# Patient Record
Sex: Female | Born: 1990 | Race: Black or African American | Hispanic: No | Marital: Single | Smoking: Never smoker
Health system: Southern US, Community
[De-identification: ages and names within clinical notes are randomized; demographics above are authoritative.]

## PROBLEM LIST (undated history)

## (undated) ENCOUNTER — Inpatient Hospital Stay (HOSPITAL_COMMUNITY): Payer: Self-pay

## (undated) DIAGNOSIS — Z3490 Encounter for supervision of normal pregnancy, unspecified, unspecified trimester: Secondary | ICD-10-CM

## (undated) DIAGNOSIS — C539 Malignant neoplasm of cervix uteri, unspecified: Secondary | ICD-10-CM

## (undated) DIAGNOSIS — A599 Trichomoniasis, unspecified: Secondary | ICD-10-CM

## (undated) DIAGNOSIS — O139 Gestational [pregnancy-induced] hypertension without significant proteinuria, unspecified trimester: Secondary | ICD-10-CM

---

## 2010-02-14 ENCOUNTER — Ambulatory Visit (HOSPITAL_COMMUNITY): Admission: RE | Admit: 2010-02-14 | Payer: Self-pay | Source: Home / Self Care | Admitting: Obstetrics and Gynecology

## 2010-02-19 ENCOUNTER — Ambulatory Visit (HOSPITAL_COMMUNITY): Admission: RE | Admit: 2010-02-19 | Payer: Self-pay | Source: Home / Self Care | Admitting: Obstetrics and Gynecology

## 2010-02-21 ENCOUNTER — Ambulatory Visit (HOSPITAL_COMMUNITY)
Admission: RE | Admit: 2010-02-21 | Discharge: 2010-02-21 | Payer: Self-pay | Source: Home / Self Care | Attending: Obstetrics and Gynecology | Admitting: Obstetrics and Gynecology

## 2010-02-25 ENCOUNTER — Other Ambulatory Visit (HOSPITAL_COMMUNITY): Payer: Self-pay | Admitting: *Deleted

## 2010-02-25 DIAGNOSIS — Z0489 Encounter for examination and observation for other specified reasons: Secondary | ICD-10-CM

## 2010-02-25 DIAGNOSIS — IMO0002 Reserved for concepts with insufficient information to code with codable children: Secondary | ICD-10-CM

## 2010-02-25 DIAGNOSIS — N83209 Unspecified ovarian cyst, unspecified side: Secondary | ICD-10-CM

## 2010-03-07 ENCOUNTER — Ambulatory Visit (HOSPITAL_COMMUNITY)
Admission: RE | Admit: 2010-03-07 | Discharge: 2010-03-07 | Disposition: A | Discharge status: Home / Self Care | Payer: Medicaid Other | Source: Ambulatory Visit | Attending: Obstetrics and Gynecology | Admitting: Obstetrics and Gynecology

## 2010-03-07 DIAGNOSIS — O351XX0 Maternal care for (suspected) chromosomal abnormality in fetus, not applicable or unspecified: Secondary | ICD-10-CM | POA: Insufficient documentation

## 2010-03-07 DIAGNOSIS — O3510X0 Maternal care for (suspected) chromosomal abnormality in fetus, unspecified, not applicable or unspecified: Secondary | ICD-10-CM | POA: Insufficient documentation

## 2010-03-28 ENCOUNTER — Ambulatory Visit (HOSPITAL_COMMUNITY)
Admission: RE | Admit: 2010-03-28 | Discharge: 2010-03-28 | Disposition: A | Discharge status: Home / Self Care | Payer: Medicaid Other | Source: Ambulatory Visit | Attending: Family Medicine | Admitting: Family Medicine

## 2010-03-28 DIAGNOSIS — N83209 Unspecified ovarian cyst, unspecified side: Secondary | ICD-10-CM

## 2010-03-28 DIAGNOSIS — O358XX0 Maternal care for other (suspected) fetal abnormality and damage, not applicable or unspecified: Secondary | ICD-10-CM | POA: Insufficient documentation

## 2010-03-28 DIAGNOSIS — Z1389 Encounter for screening for other disorder: Secondary | ICD-10-CM | POA: Insufficient documentation

## 2010-03-28 DIAGNOSIS — Z0489 Encounter for examination and observation for other specified reasons: Secondary | ICD-10-CM

## 2010-03-28 DIAGNOSIS — Z363 Encounter for antenatal screening for malformations: Secondary | ICD-10-CM | POA: Insufficient documentation

## 2010-04-10 ENCOUNTER — Other Ambulatory Visit (HOSPITAL_COMMUNITY): Payer: Self-pay | Admitting: Obstetrics and Gynecology

## 2010-04-10 DIAGNOSIS — O358XX0 Maternal care for other (suspected) fetal abnormality and damage, not applicable or unspecified: Secondary | ICD-10-CM

## 2010-04-25 ENCOUNTER — Ambulatory Visit (HOSPITAL_COMMUNITY)
Admission: RE | Admit: 2010-04-25 | Discharge: 2010-04-25 | Disposition: A | Discharge status: Home / Self Care | Payer: Medicaid Other | Source: Ambulatory Visit | Attending: Obstetrics and Gynecology | Admitting: Obstetrics and Gynecology

## 2010-04-25 DIAGNOSIS — O358XX0 Maternal care for other (suspected) fetal abnormality and damage, not applicable or unspecified: Secondary | ICD-10-CM | POA: Insufficient documentation

## 2010-04-25 DIAGNOSIS — Z3689 Encounter for other specified antenatal screening: Secondary | ICD-10-CM | POA: Insufficient documentation

## 2010-04-25 DIAGNOSIS — O3500X0 Maternal care for (suspected) central nervous system malformation or damage in fetus, unspecified, not applicable or unspecified: Secondary | ICD-10-CM | POA: Insufficient documentation

## 2010-04-25 DIAGNOSIS — O350XX0 Maternal care for (suspected) central nervous system malformation in fetus, not applicable or unspecified: Secondary | ICD-10-CM | POA: Insufficient documentation

## 2013-06-10 ENCOUNTER — Emergency Department (HOSPITAL_COMMUNITY)
Admission: EM | Admit: 2013-06-10 | Discharge: 2013-06-10 | Disposition: A | Discharge status: Home / Self Care | Payer: Medicaid Other | Attending: Emergency Medicine | Admitting: Emergency Medicine

## 2013-06-10 ENCOUNTER — Encounter (HOSPITAL_COMMUNITY): Payer: Self-pay | Admitting: Emergency Medicine

## 2013-06-10 ENCOUNTER — Emergency Department (HOSPITAL_COMMUNITY): Payer: Medicaid Other

## 2013-06-10 DIAGNOSIS — O9989 Other specified diseases and conditions complicating pregnancy, childbirth and the puerperium: Secondary | ICD-10-CM | POA: Insufficient documentation

## 2013-06-10 DIAGNOSIS — S92402A Displaced unspecified fracture of left great toe, initial encounter for closed fracture: Secondary | ICD-10-CM

## 2013-06-10 DIAGNOSIS — S91119A Laceration without foreign body of unspecified toe without damage to nail, initial encounter: Secondary | ICD-10-CM

## 2013-06-10 DIAGNOSIS — S3981XA Other specified injuries of abdomen, initial encounter: Secondary | ICD-10-CM | POA: Insufficient documentation

## 2013-06-10 DIAGNOSIS — S91109A Unspecified open wound of unspecified toe(s) without damage to nail, initial encounter: Secondary | ICD-10-CM | POA: Insufficient documentation

## 2013-06-10 DIAGNOSIS — S92919A Unspecified fracture of unspecified toe(s), initial encounter for closed fracture: Secondary | ICD-10-CM | POA: Insufficient documentation

## 2013-06-10 DIAGNOSIS — W1809XA Striking against other object with subsequent fall, initial encounter: Secondary | ICD-10-CM | POA: Insufficient documentation

## 2013-06-10 DIAGNOSIS — W19XXXA Unspecified fall, initial encounter: Secondary | ICD-10-CM

## 2013-06-10 DIAGNOSIS — Y929 Unspecified place or not applicable: Secondary | ICD-10-CM | POA: Insufficient documentation

## 2013-06-10 DIAGNOSIS — Y9301 Activity, walking, marching and hiking: Secondary | ICD-10-CM | POA: Insufficient documentation

## 2013-06-10 DIAGNOSIS — W108XXA Fall (on) (from) other stairs and steps, initial encounter: Secondary | ICD-10-CM | POA: Insufficient documentation

## 2013-06-10 HISTORY — DX: Encounter for supervision of normal pregnancy, unspecified, unspecified trimester: Z34.90

## 2013-06-10 LAB — URINALYSIS, ROUTINE W REFLEX MICROSCOPIC
Bilirubin Urine: NEGATIVE
Glucose, UA: NEGATIVE mg/dL
Hgb urine dipstick: NEGATIVE
Ketones, ur: NEGATIVE mg/dL
LEUKOCYTES UA: NEGATIVE
Nitrite: NEGATIVE
PROTEIN: NEGATIVE mg/dL
Specific Gravity, Urine: 1.016 (ref 1.005–1.030)
UROBILINOGEN UA: 1 mg/dL (ref 0.0–1.0)
pH: 6.5 (ref 5.0–8.0)

## 2013-06-10 LAB — CBC
HEMATOCRIT: 34.3 % — AB (ref 36.0–46.0)
Hemoglobin: 11.3 g/dL — ABNORMAL LOW (ref 12.0–15.0)
MCH: 29.4 pg (ref 26.0–34.0)
MCHC: 32.9 g/dL (ref 30.0–36.0)
MCV: 89.3 fL (ref 78.0–100.0)
Platelets: 147 10*3/uL — ABNORMAL LOW (ref 150–400)
RBC: 3.84 MIL/uL — ABNORMAL LOW (ref 3.87–5.11)
RDW: 15.7 % — AB (ref 11.5–15.5)
WBC: 8.2 10*3/uL (ref 4.0–10.5)

## 2013-06-10 LAB — COMPREHENSIVE METABOLIC PANEL
ALT: 11 U/L (ref 0–35)
AST: 19 U/L (ref 0–37)
Albumin: 2.3 g/dL — ABNORMAL LOW (ref 3.5–5.2)
Alkaline Phosphatase: 106 U/L (ref 39–117)
BUN: 8 mg/dL (ref 6–23)
CHLORIDE: 99 meq/L (ref 96–112)
CO2: 20 meq/L (ref 19–32)
CREATININE: 0.6 mg/dL (ref 0.50–1.10)
Calcium: 9.1 mg/dL (ref 8.4–10.5)
Glucose, Bld: 113 mg/dL — ABNORMAL HIGH (ref 70–99)
Potassium: 3.7 mEq/L (ref 3.7–5.3)
Sodium: 135 mEq/L — ABNORMAL LOW (ref 137–147)
Total Protein: 6.1 g/dL (ref 6.0–8.3)

## 2013-06-10 NOTE — Discharge Instructions (Signed)
Buddy Taping of Toes We have taped your toes together to keep them from moving. This is called "buddy taping" since we used a part of your own body to keep the injured part still. We placed soft padding between your toes to keep them from rubbing against each other. Buddy taping will help with healing and to reduce pain. Keep your toes buddy taped together for as long as directed by your caregiver. HOME CARE INSTRUCTIONS   Raise your injured area above the level of your heart while sitting or lying down. Prop it up with pillows.  An ice pack used every twenty minutes, while awake, for the first one to two days may be helpful. Put ice in a plastic bag and put a towel between the bag and your skin.  Watch for signs that the taping is too tight. These signs may be:  Numbness of your taped toes.  Coolness of your taped toes.  Color change in the area beyond the tape.  Increased pain.  If you have any of these signs, loosen or rewrap the tape. If you need to loosen or rewrap the buddy tape, make sure you use the padding again. SEEK IMMEDIATE MEDICAL CARE IF:   You have worse pain, swelling, inflammation (soreness), drainage or bleeding after you rewrap the tape.  Any new problems occur. MAKE SURE YOU:   Understand these instructions.  Will watch your condition.  Will get help right away if you are not doing well or get worse. Document Released: 10/17/2003 Document Revised: 04/06/2011 Document Reviewed: 01/10/2008 [facility] Patient Information 2014 [place].

## 2013-06-10 NOTE — ED Notes (Signed)
The pt remains alert with an occasional contraction.  Ob rapid  Response rn at the bedside.  Family at the bedside

## 2013-06-10 NOTE — ED Provider Notes (Signed)
CSN: [id number]     Arrival date & time 06/10/13  1407 History   First MD Initiated Contact with Patient 06/10/13 1433     Chief Complaint  Patient presents with  . Fall     (Consider location/radiation/quality/duration/timing/severity/associated sxs/prior Treatment) HPI 23 yo female presents after tripping down the stairs. She is [redacted] weeks pregnant (due date June 20) and fell down 4 steps after tripping on her sandals. She also hit her right flank/abdomen. She denies any cramping, vaginal bleeding or discharge. Has been feeling fetal movements like normal since the injury (just prior to arrival). Denies any numbness or weakness. No vomiting. Pain in her right side is 5/10, toe pain is 10/10.  Past Medical History  Diagnosis Date  . Pregnant    History reviewed. No pertinent past surgical history. No family history on file. History  Substance Use Topics  . Smoking status: Not on file  . Smokeless tobacco: Not on file  . Alcohol Use: No   OB History   Grav Para Term Preterm Abortions TAB SAB Ect Mult Living   1              Review of Systems  Cardiovascular: Negative for chest pain.  Gastrointestinal: Positive for abdominal pain. Negative for vomiting.  Genitourinary: Negative for hematuria and vaginal bleeding.  Skin: Positive for wound.  Neurological: Negative for weakness and numbness.      Allergies  Review of patient's allergies indicates not on file.  Home Medications   Prior to Admission medications   Not on File   BP 113/70  Pulse 110  Temp(Src) 98 F (36.7 C) (Oral)  Resp 18  SpO2 98%  LMP 10/24/2012 Physical Exam  Nursing note and vitals reviewed. Constitutional: She is oriented to person, place, and time. She appears well-developed and well-nourished.  HENT:  Head: Normocephalic and atraumatic.  Right Ear: External ear normal.  Left Ear: External ear normal.  Nose: Nose normal.  Eyes: Right eye exhibits no discharge. Left eye exhibits no  discharge.  Cardiovascular: Normal rate, regular rhythm and normal heart sounds.   Pulmonary/Chest: Effort normal and breath sounds normal.  Abdominal: Soft. There is no tenderness. There is no CVA tenderness.    Musculoskeletal:       Left foot: She exhibits tenderness.       Feet:  Neurological: She is alert and oriented to person, place, and time.  Skin: Skin is warm and dry.    ED Course  Procedures (including critical care time) Labs Review Labs Reviewed  CBC - Abnormal; Notable for the following:    RBC 3.84 (*)    Hemoglobin 11.3 (*)    HCT 34.3 (*)    RDW 15.7 (*)    Platelets 147 (*)    All other components within normal limits  COMPREHENSIVE METABOLIC PANEL - Abnormal; Notable for the following:    Sodium 135 (*)    Glucose, Bld 113 (*)    Albumin 2.3 (*)    Total Bilirubin <0.2 (*)    All other components within normal limits  URINALYSIS, ROUTINE W REFLEX MICROSCOPIC    Imaging Review Dg Toe Great Left  06/10/2013   CLINICAL DATA:  Status post fall with injury to the great toe  EXAM: LEFT GREAT TOE  COMPARISON:  None.  FINDINGS: There is a nondisplaced intra-articular fracture at the base of the first distal phalanx. There is no dislocation.  IMPRESSION: Nondisplaced intra-articular fracture at the base of the first distal  phalanx.   Electronically Signed   By: Abelardo Diesel M.D.   On: 06/10/2013 14:56     EKG Interpretation None      MDM   Final diagnoses:  Skin tear  Fracture of left great toe  Fall    Patient with fall, no LOC or head injury. Mild superficial right flank tenderness, no hematuria or concern for intra-abd injury. Benign LFTs, normal RUQ and LUQ FAST by me (difficult for suprapubic given her pregnancy. Has superficial skin tear/lac to medial aspect of left great toe. Has nondisplaced fx as well. Declines pain meds at this time or with discharge. Lac is well approximated and supreficial, no signs this is an open fracture. Well  approximated at this time and the lacerated skin part is thin enough that I do not feel repair would be beneficial. Will put on toco for 1 hour (rapid response OB nurse at bedside) and will communicate with Dr. Roselie Awkward (OB on call) and if toco negative will discharge with return precautions.    Ephraim Hamburger, MD 06/10/13 [phone]

## 2013-06-10 NOTE — Progress Notes (Signed)
Spoke with Dr. Roselie Awkward. Pt says she fell down 4 steps scraping the rt side of her abd. No vaginal bleeding or leaking of fluid. Waiting for xray of pt's toe to be done before I can place pt on monitor. Pt can have 1 hour of monitoring as long as the fhr is reactive. Pt says that she is [redacted] weeks pregnant. Her MD is in [facility].

## 2013-06-10 NOTE — ED Notes (Addendum)
Per OB Rapid Response RN, pt is cleared obstetrically.

## 2013-06-10 NOTE — Progress Notes (Signed)
Unable to admit pt into [facility]. Running FM paper.

## 2013-06-10 NOTE — Progress Notes (Signed)
Spoke to Dr. Roselie Awkward. FHR tracing is reactive. Occasional uc's with ui, better after pt emptied her bladder. Obstetrically cleared. ED MD plans to d/c pt home at this point. Labs are within normal limits.

## 2013-06-10 NOTE — Progress Notes (Signed)
This is the pt's 3rd pregnancy. Previous pregnancies were vaginal births. Due date is June 20th. Pt is [redacted] weeks pregnant.

## 2013-06-10 NOTE — Progress Notes (Signed)
Orthopedic Tech Progress Note Patient Details:  Julia Roth August 25, 1990 [id number]  Ortho Devices Type of Ortho Device: Post (short leg) splint Ortho Device/Splint Interventions: Application   Theodoro Parma Cammer 06/10/2013, 5:43 PM

## 2013-06-10 NOTE — ED Notes (Signed)
Pt. Stated, I was walking down steps and fell down 4 steps trying to keep up with my boys.  I hurt my left toe, I had on sandals.  I also got some pain in y lower abd.

## 2013-06-10 NOTE — Progress Notes (Signed)
Chaplain responded to trauma page.  Talked with patient, with her husband and with other family members.  All are concerned about grandmother, who is patient and is the reason family was coming to the hospital.  Rev. Clois Dupes [phone]

## 2013-11-27 ENCOUNTER — Encounter (HOSPITAL_COMMUNITY): Payer: Self-pay | Admitting: Emergency Medicine

## 2015-05-11 ENCOUNTER — Encounter (HOSPITAL_COMMUNITY): Payer: Self-pay | Admitting: Emergency Medicine

## 2015-05-11 ENCOUNTER — Inpatient Hospital Stay (HOSPITAL_COMMUNITY)
Admission: EM | Admit: 2015-05-11 | Discharge: 2015-05-11 | Disposition: A | Discharge status: Other Acute Inpatient Hospital | Payer: Medicaid Other | Attending: Family Medicine | Admitting: Family Medicine

## 2015-05-11 DIAGNOSIS — Z3A26 26 weeks gestation of pregnancy: Secondary | ICD-10-CM | POA: Insufficient documentation

## 2015-05-11 DIAGNOSIS — A5901 Trichomonal vulvovaginitis: Secondary | ICD-10-CM | POA: Diagnosis not present

## 2015-05-11 DIAGNOSIS — Z8541 Personal history of malignant neoplasm of cervix uteri: Secondary | ICD-10-CM | POA: Diagnosis not present

## 2015-05-11 DIAGNOSIS — O4702 False labor before 37 completed weeks of gestation, second trimester: Secondary | ICD-10-CM

## 2015-05-11 DIAGNOSIS — O479 False labor, unspecified: Secondary | ICD-10-CM | POA: Insufficient documentation

## 2015-05-11 DIAGNOSIS — O98612 Protozoal diseases complicating pregnancy, second trimester: Secondary | ICD-10-CM | POA: Insufficient documentation

## 2015-05-11 DIAGNOSIS — O98312 Other infections with a predominantly sexual mode of transmission complicating pregnancy, second trimester: Secondary | ICD-10-CM

## 2015-05-11 DIAGNOSIS — O9989 Other specified diseases and conditions complicating pregnancy, childbirth and the puerperium: Secondary | ICD-10-CM | POA: Diagnosis present

## 2015-05-11 DIAGNOSIS — Z79899 Other long term (current) drug therapy: Secondary | ICD-10-CM | POA: Insufficient documentation

## 2015-05-11 HISTORY — DX: Malignant neoplasm of cervix uteri, unspecified: C53.9

## 2015-05-11 HISTORY — DX: Trichomoniasis, unspecified: A59.9

## 2015-05-11 HISTORY — DX: Gestational (pregnancy-induced) hypertension without significant proteinuria, unspecified trimester: O13.9

## 2015-05-11 LAB — URINALYSIS, ROUTINE W REFLEX MICROSCOPIC
Bilirubin Urine: NEGATIVE
GLUCOSE, UA: NEGATIVE mg/dL
Ketones, ur: NEGATIVE mg/dL
NITRITE: NEGATIVE
PH: 6 (ref 5.0–8.0)
Protein, ur: NEGATIVE mg/dL
Specific Gravity, Urine: 1.01 (ref 1.005–1.030)

## 2015-05-11 LAB — CBC
HEMATOCRIT: 35.5 % — AB (ref 36.0–46.0)
HEMOGLOBIN: 11.4 g/dL — AB (ref 12.0–15.0)
MCH: 29.3 pg (ref 26.0–34.0)
MCHC: 32.1 g/dL (ref 30.0–36.0)
MCV: 91.3 fL (ref 78.0–100.0)
Platelets: 191 10*3/uL (ref 150–400)
RBC: 3.89 MIL/uL (ref 3.87–5.11)
RDW: 14.9 % (ref 11.5–15.5)
WBC: 10.1 10*3/uL (ref 4.0–10.5)

## 2015-05-11 LAB — URINE MICROSCOPIC-ADD ON

## 2015-05-11 LAB — COMPREHENSIVE METABOLIC PANEL
ALK PHOS: 60 U/L (ref 38–126)
ALT: 12 U/L — AB (ref 14–54)
ANION GAP: 11 (ref 5–15)
AST: 18 U/L (ref 15–41)
Albumin: 2.8 g/dL — ABNORMAL LOW (ref 3.5–5.0)
BILIRUBIN TOTAL: 0.3 mg/dL (ref 0.3–1.2)
BUN: <5 mg/dL — ABNORMAL LOW (ref 6–20)
CALCIUM: 8.3 mg/dL — AB (ref 8.9–10.3)
CO2: 21 mmol/L — AB (ref 22–32)
CREATININE: 0.62 mg/dL (ref 0.44–1.00)
Chloride: 105 mmol/L (ref 101–111)
GFR calc non Af Amer: >60 mL/min (ref >60)
GLUCOSE: 73 mg/dL (ref 65–99)
Potassium: 3.8 mmol/L (ref 3.5–5.1)
SODIUM: 137 mmol/L (ref 135–145)
TOTAL PROTEIN: 6.6 g/dL (ref 6.5–8.1)

## 2015-05-11 LAB — AMNISURE RUPTURE OF MEMBRANE (ROM) NOT AT ARMC: Amnisure ROM: NEGATIVE

## 2015-05-11 MED ORDER — METRONIDAZOLE 500 MG PO TABS
2000.0000 mg | ORAL_TABLET | Freq: Once | ORAL | Status: AC
  Administered 2015-05-11: 2000 mg via ORAL
  Filled 2015-05-11: qty 4

## 2015-05-11 NOTE — Progress Notes (Addendum)
[facility] Report called to [facility] in MAU

## 2015-05-11 NOTE — ED Provider Notes (Signed)
CSN: [id number]     Arrival date & time 05/11/15  [id number] History   First MD Initiated Contact with Patient 05/11/15 [phone]     Chief Complaint  Patient presents with  . Vaginal Discharge  . Contractions     The history is provided by the patient. No language interpreter was used.   Julia Roth is a 25 y.o. female who presents to the Emergency Department complaining of contractions.  Patient is a G4 P3 at 26 weeks with due date of July 20. She's been having intermittent contractions since last night and this morning felt a gush of fluid. Her OB/GYN is at [place]. She's had no problems with prior pregnancies. Today her contractions have been approximately 3 minutes apart lasting about 1 minute a piece. No recent illnesses or abdominal trauma. No fevers, vomiting, diarrhea.  Past Medical History  Diagnosis Date  . Pregnant   . Pregnancy induced hypertension   . Cervical cancer ([place])   . Trichomonas infection    History reviewed. No pertinent past surgical history. History reviewed. No pertinent family history. Social History  Substance Use Topics  . Smoking status: Never Smoker   . Smokeless tobacco: None  . Alcohol Use: No   OB History    Gravida Para Term Preterm AB TAB SAB Ectopic Multiple Living   5 3   1  1   3      Review of Systems  All other systems reviewed and are negative.     Allergies  Mushroom extract complex  Home Medications   Prior to Admission medications   Medication Sig Start Date End Date Taking? Authorizing Provider  Prenatal Vit-Fe Fumarate-FA (PRENATAL MULTIVITAMIN) TABS tablet Take 1 tablet by mouth daily at 12 noon.    Yes Historical Provider, MD   BP 129/66 mmHg  Pulse 93  Temp(Src) 98.1 F (36.7 C) (Oral)  Resp 18  SpO2 98% Physical Exam  Constitutional: She is oriented to person, place, and time. She appears well-developed and well-nourished.  HENT:  Head: Normocephalic and atraumatic.  Cardiovascular: Normal  rate and regular rhythm.   No murmur heard. Pulmonary/Chest: Effort normal and breath sounds normal. No respiratory distress.  Abdominal: Soft. There is no rebound and no guarding.  Gravid uterus, nontender abdomen.  Genitourinary:  Speculum examination with large amounts of thick yellow discharge. No fetal parts seen  Musculoskeletal: She exhibits no edema or tenderness.  Neurological: She is alert and oriented to person, place, and time.  Skin: Skin is warm and dry.  Psychiatric: She has a normal mood and affect. Her behavior is normal.  Nursing note and vitals reviewed.   ED Course  Procedures (including critical care time) Labs Review Labs Reviewed  CBC - Abnormal; Notable for the following:    Hemoglobin 11.4 (*)    HCT 35.5 (*)    All other components within normal limits  COMPREHENSIVE METABOLIC PANEL - Abnormal; Notable for the following:    CO2 21 (*)    BUN <5 (*)    Calcium 8.3 (*)    Albumin 2.8 (*)    ALT 12 (*)    All other components within normal limits  URINALYSIS, ROUTINE W REFLEX MICROSCOPIC (NOT AT [facility]) - Abnormal; Notable for the following:    APPearance HAZY (*)    Hgb urine dipstick SMALL (*)    Leukocytes, UA LARGE (*)    All other components within normal limits  URINE MICROSCOPIC-ADD ON - Abnormal; Notable for the  following:    Squamous Epithelial / LPF 6-30 (*)    Bacteria, UA MANY (*)    All other components within normal limits  AMNISURE RUPTURE OF MEMBRANE (ROM) NOT AT [facility]  GC/CHLAMYDIA PROBE AMP ([place]) NOT AT [facility]    Imaging Review No results found. I have personally reviewed and evaluated these images and lab results as part of my medical decision-making.   EKG Interpretation None      MDM   Final diagnoses:  Vaginal trichomoniasis    Patient's [redacted] weeks pregnant here with contractions, leakage of fluids. She is having irregular contractions in the department. Speculum exam with large amounts of discharge. Plan to  transfer to [facility] for evaluation of possible preterm labor. Discussed with Dr. Kennon Rounds at [facility] that accepts the patient in transfer.    Quintella Reichert, MD 05/11/15 [phone]

## 2015-05-11 NOTE — Progress Notes (Signed)
Called dr Kennon Rounds informed of being called to [place], for g5 p3 26.2 weeks complaints of leaking fluid and contractions. Pt gets prenatal care in [place]. To have come MD do spec exam. Will call her back with results.

## 2015-05-11 NOTE — MAU Note (Signed)
[facility] from [facility]

## 2015-05-11 NOTE — ED Notes (Signed)
MD Ralene Bathe at bedside, Performed speculum exam. Tan colored secretions noted on speculum with little amount of blood.

## 2015-05-11 NOTE — MAU Provider Note (Signed)
History   [id number]   Chief Complaint  Patient presents with  . Vaginal Discharge  . Contractions    HPI Julia Roth is a 25 y.o. female  [id number] at [redacted]w[redacted]d IUP here from [facility] ED for rule-out rupture.  Pt presented to ED for contractions that started last night and suspected rupture of membranes this morning.  Receives prenatal care in [place].  Denies problems with prior pregnancies.  Reports treatment for trichomoniasis x 2 during this pregnancy.  Denies sexual encounter with that individual since first time treated.  Verbalizes being upset that it's present in urine today.  States contractions has lessened.    No LMP recorded. Patient is pregnant.  OB History  Gravida Para Term Preterm AB SAB TAB Ectopic Multiple Living  5 3   1 1    3     # Outcome Date GA Lbr Len/2nd Weight Sex Delivery Anes PTL Lv  5 Current           4 SAB           3 Para           2 Para           1 Para               Past Medical History  Diagnosis Date  . Pregnant   . Pregnancy induced hypertension   . Cervical cancer ([place])   . Trichomonas infection     History reviewed. No pertinent family history.  Social History   Social History  . Marital Status: Single    Spouse Name: N/A  . Number of Children: N/A  . Years of Education: N/A   Social History Main Topics  . Smoking status: Never Smoker   . Smokeless tobacco: None  . Alcohol Use: No  . Drug Use: No  . Sexual Activity: Not Asked   Other Topics Concern  . None   Social History Narrative    Allergies  Allergen Reactions  . Mushroom Extract Complex Swelling    Face swelling    No current facility-administered medications on file prior to encounter.   Current Outpatient Prescriptions on File Prior to Encounter  Medication Sig Dispense Refill  . Prenatal Vit-Fe Fumarate-FA (PRENATAL MULTIVITAMIN) TABS tablet Take 1 tablet by mouth daily at 12 noon.        Review of Systems  Constitutional: Negative for  fever and chills.  Genitourinary: Positive for vaginal discharge and pelvic pain (contractions). Negative for dysuria, hematuria and vaginal bleeding.  All other systems reviewed and are negative.    Physical Exam   Filed Vitals:   05/11/15 1015 05/11/15 1030 05/11/15 1045 05/11/15 1115  BP: 100/64 104/56 104/58 113/60  Pulse: 86 93 87 72  Temp:    98.1 F (36.7 C)  TempSrc:    Oral  Resp:    18  SpO2: 97% 99% 98%     Physical Exam  Constitutional: She is oriented to person, place, and time. She appears well-developed and well-nourished.  HENT:  Head: Normocephalic.  Neck: Normal range of motion. Neck supple.  Cardiovascular: Normal rate, regular rhythm and normal heart sounds.   Respiratory: Effort normal and breath sounds normal. No respiratory distress.  GI: Soft. There is no tenderness.  Genitourinary: No bleeding in the vagina. Vaginal discharge (thin yellowish) found.  Negative pooling; fern negative  Musculoskeletal: Normal range of motion. She exhibits no edema.  Neurological: She is alert and oriented to person, place,  and time.  Skin: Skin is warm and dry.   Dilation: Closed Effacement (%): Thick Cervical Position: Posterior Exam by:: Reina Fuse, CNM   MAU Course  Procedures  MDM Results for orders placed or performed during the hospital encounter of 05/11/15 (from the past 24 hour(s))  CBC     Status: Abnormal   Collection Time: 05/11/15  9:40 AM  Result Value Ref Range   WBC 10.1 4.0 - 10.5 K/uL   RBC 3.89 3.87 - 5.11 MIL/uL   Hemoglobin 11.4 (L) 12.0 - 15.0 g/dL   HCT 35.5 (L) 36.0 - 46.0 %   MCV 91.3 78.0 - 100.0 fL   MCH 29.3 26.0 - 34.0 pg   MCHC 32.1 30.0 - 36.0 g/dL   RDW 14.9 11.5 - 15.5 %   Platelets 191 150 - 400 K/uL  Comprehensive metabolic panel     Status: Abnormal   Collection Time: 05/11/15  9:40 AM  Result Value Ref Range   Sodium 137 135 - 145 mmol/L   Potassium 3.8 3.5 - 5.1 mmol/L   Chloride 105 101 - 111 mmol/L   CO2 21 (L) 22  - 32 mmol/L   Glucose, Bld 73 65 - 99 mg/dL   BUN <5 (L) 6 - 20 mg/dL   Creatinine, Ser 0.62 0.44 - 1.00 mg/dL   Calcium 8.3 (L) 8.9 - 10.3 mg/dL   Total Protein 6.6 6.5 - 8.1 g/dL   Albumin 2.8 (L) 3.5 - 5.0 g/dL   AST 18 15 - 41 U/L   ALT 12 (L) 14 - 54 U/L   Alkaline Phosphatase 60 38 - 126 U/L   Total Bilirubin 0.3 0.3 - 1.2 mg/dL   GFR calc non Af Amer >60 >60 mL/min   GFR calc Af Amer >60 >60 mL/min   Anion gap 11 5 - 15  Urinalysis, Routine w reflex microscopic (not at [facility])     Status: Abnormal   Collection Time: 05/11/15 11:00 AM  Result Value Ref Range   Color, Urine YELLOW YELLOW   APPearance HAZY (A) CLEAR   Specific Gravity, Urine 1.010 1.005 - 1.030   pH 6.0 5.0 - 8.0   Glucose, UA NEGATIVE NEGATIVE mg/dL   Hgb urine dipstick SMALL (A) NEGATIVE   Bilirubin Urine NEGATIVE NEGATIVE   Ketones, ur NEGATIVE NEGATIVE mg/dL   Protein, ur NEGATIVE NEGATIVE mg/dL   Nitrite NEGATIVE NEGATIVE   Leukocytes, UA LARGE (A) NEGATIVE  Urine microscopic-add on     Status: Abnormal   Collection Time: 05/11/15 11:00 AM  Result Value Ref Range   Squamous Epithelial / LPF 6-30 (A) NONE SEEN   WBC, UA TOO NUMEROUS TO COUNT 0 - 5 WBC/hpf   RBC / HPF 6-30 0 - 5 RBC/hpf   Bacteria, UA MANY (A) NONE SEEN   Urine-Other TRICHOMONAS PRESENT   Amnisure rupture of membrane (rom)not at [facility]     Status: None   Collection Time: 05/11/15 12:08 PM  Result Value Ref Range   Amnisure ROM NEGATIVE    FHR 130's Toco - irregular  Assessment and Plan  25 y.o. [id number] at [redacted]w[redacted]d IUP  Trichomoniasis Braxton Hicks - closed cervix  Plan: Discharge home 2 GM Flagyl in MAU Follow-up with OB office Reviewed preterm labor precautions  Gwen Pounds, CNM 05/11/2015 1:50 PM

## 2015-05-11 NOTE — ED Notes (Signed)
Pt c/o having abdominal contractions onset yesterday. Today pt was in kitchen fixing cereal for her other child when fluid began trickle out. Pt reports fluid is clear in color. Pt having irregular contractions at this time.

## 2015-05-11 NOTE — ED Notes (Signed)
Rapid OB nurse at bedside. Pt continues to have contractions reports they are getting stronger.

## 2015-05-11 NOTE — Discharge Instructions (Signed)
Trichomoniasis Trichomoniasis is an infection caused by an organism called Trichomonas. The infection can affect both women and men. In women, the outer female genitalia and the vagina are affected. In men, the penis is mainly affected, but the prostate and other reproductive organs can also be involved. Trichomoniasis is a sexually transmitted infection (STI) and is most often passed to another person through sexual contact.  RISK FACTORS  Having unprotected sexual intercourse.  Having sexual intercourse with an infected partner. SIGNS AND SYMPTOMS  Symptoms of trichomoniasis in women include:  Abnormal gray-green frothy vaginal discharge.  Itching and irritation of the vagina.  Itching and irritation of the area outside the vagina. Symptoms of trichomoniasis in men include:   Penile discharge with or without pain.  Pain during urination. This results from inflammation of the urethra. DIAGNOSIS  Trichomoniasis may be found during a Pap test or physical exam. Your health care provider may use one of the following methods to help diagnose this infection:  Testing the pH of the vagina with a test tape.  Using a vaginal swab test that checks for the Trichomonas organism. A test is available that provides results within a few minutes.  Examining a urine sample.  Testing vaginal secretions. Your health care provider may test you for other STIs, including HIV. TREATMENT   You may be given medicine to fight the infection. Women should inform their health care provider if they could be or are pregnant. Some medicines used to treat the infection should not be taken during pregnancy.  Your health care provider may recommend over-the-counter medicines or creams to decrease itching or irritation.  Your sexual partner will need to be treated if infected.  Your health care provider may test you for infection again 3 months after treatment. HOME CARE INSTRUCTIONS   Take medicines only as  directed by your health care provider.  Take over-the-counter medicine for itching or irritation as directed by your health care provider.  Do not have sexual intercourse while you have the infection.  Women should not douche or wear tampons while they have the infection.  Discuss your infection with your partner. Your partner may have gotten the infection from you, or you may have gotten it from your partner.  Have your sex partner get examined and treated if necessary.  Practice safe, informed, and protected sex.  See your health care provider for other STI testing. SEEK MEDICAL CARE IF:   You still have symptoms after you finish your medicine.  You develop abdominal pain.  You have pain when you urinate.  You have bleeding after sexual intercourse.  You develop a rash.  Your medicine makes you sick or makes you throw up (vomit). MAKE SURE YOU:  Understand these instructions.  Will watch your condition.  Will get help right away if you are not doing well or get worse.   This information is not intended to replace advice given to you by your health care provider. Make sure you discuss any questions you have with your health care provider.   Document Released: 07/08/2000 Document Revised: 02/02/2014 Document Reviewed: 10/24/2012 Elsevier Interactive Patient Education 2016 [employer]  

## 2015-05-13 LAB — GC/CHLAMYDIA PROBE AMP (~~LOC~~) NOT AT ARMC
Chlamydia: NEGATIVE
NEISSERIA GONORRHEA: NEGATIVE

## 2016-03-15 ENCOUNTER — Encounter (HOSPITAL_COMMUNITY): Payer: Self-pay

## 2020-03-30 ENCOUNTER — Emergency Department (HOSPITAL_BASED_OUTPATIENT_CLINIC_OR_DEPARTMENT_OTHER): Payer: Self-pay

## 2020-03-30 ENCOUNTER — Encounter (HOSPITAL_BASED_OUTPATIENT_CLINIC_OR_DEPARTMENT_OTHER): Payer: Self-pay

## 2020-03-30 ENCOUNTER — Emergency Department (HOSPITAL_BASED_OUTPATIENT_CLINIC_OR_DEPARTMENT_OTHER)
Admission: EM | Admit: 2020-03-30 | Discharge: 2020-03-30 | Disposition: A | Discharge status: Home / Self Care | Payer: Self-pay | Attending: Emergency Medicine | Admitting: Emergency Medicine

## 2020-03-30 ENCOUNTER — Other Ambulatory Visit: Payer: Self-pay

## 2020-03-30 DIAGNOSIS — Z8541 Personal history of malignant neoplasm of cervix uteri: Secondary | ICD-10-CM | POA: Insufficient documentation

## 2020-03-30 DIAGNOSIS — R1032 Left lower quadrant pain: Secondary | ICD-10-CM | POA: Insufficient documentation

## 2020-03-30 DIAGNOSIS — N390 Urinary tract infection, site not specified: Secondary | ICD-10-CM | POA: Insufficient documentation

## 2020-03-30 DIAGNOSIS — M545 Low back pain, unspecified: Secondary | ICD-10-CM | POA: Insufficient documentation

## 2020-03-30 LAB — COMPREHENSIVE METABOLIC PANEL
ALT: 19 U/L (ref 0–44)
AST: 21 U/L (ref 15–41)
Albumin: 4 g/dL (ref 3.5–5.0)
Alkaline Phosphatase: 34 U/L — ABNORMAL LOW (ref 38–126)
Anion gap: 8 (ref 5–15)
BUN: 14 mg/dL (ref 6–20)
CO2: 25 mmol/L (ref 22–32)
Calcium: 9 mg/dL (ref 8.9–10.3)
Chloride: 107 mmol/L (ref 98–111)
Creatinine, Ser: 0.94 mg/dL (ref 0.44–1.00)
GFR, Estimated: >60 mL/min (ref >60)
Glucose, Bld: 100 mg/dL — ABNORMAL HIGH (ref 70–99)
Potassium: 4 mmol/L (ref 3.5–5.1)
Sodium: 140 mmol/L (ref 135–145)
Total Bilirubin: 0.6 mg/dL (ref 0.3–1.2)
Total Protein: 6.6 g/dL (ref 6.5–8.1)

## 2020-03-30 LAB — CBC
HCT: 37.9 % (ref 36.0–46.0)
Hemoglobin: 12.3 g/dL (ref 12.0–15.0)
MCH: 28.9 pg (ref 26.0–34.0)
MCHC: 32.5 g/dL (ref 30.0–36.0)
MCV: 89 fL (ref 80.0–100.0)
Platelets: 203 10*3/uL (ref 150–400)
RBC: 4.26 MIL/uL (ref 3.87–5.11)
RDW: 15.4 % (ref 11.5–15.5)
WBC: 7 10*3/uL (ref 4.0–10.5)
nRBC: 0 % (ref 0.0–0.2)

## 2020-03-30 LAB — URINALYSIS, MICROSCOPIC (REFLEX)

## 2020-03-30 LAB — URINALYSIS, ROUTINE W REFLEX MICROSCOPIC
Bilirubin Urine: NEGATIVE
Glucose, UA: NEGATIVE mg/dL
Hgb urine dipstick: NEGATIVE
Ketones, ur: NEGATIVE mg/dL
Nitrite: NEGATIVE
Protein, ur: NEGATIVE mg/dL
Specific Gravity, Urine: 1.025 (ref 1.005–1.030)
pH: 6 (ref 5.0–8.0)

## 2020-03-30 LAB — LIPASE, BLOOD: Lipase: 28 U/L (ref 11–51)

## 2020-03-30 LAB — PREGNANCY, URINE: Preg Test, Ur: NEGATIVE

## 2020-03-30 MED ORDER — IOHEXOL 300 MG/ML  SOLN
100.0000 mL | Freq: Once | INTRAMUSCULAR | Status: AC | PRN
  Administered 2020-03-30: 100 mL via INTRAVENOUS

## 2020-03-30 MED ORDER — METHOCARBAMOL 500 MG PO TABS: 500.0000 mg | ORAL_TABLET | Freq: Two times a day (BID) | ORAL | 0 refills | Status: DC

## 2020-03-30 MED ORDER — CEPHALEXIN 500 MG PO CAPS: 500.0000 mg | ORAL_CAPSULE | Freq: Two times a day (BID) | ORAL | 0 refills | Status: AC

## 2020-03-30 MED ORDER — LIDOCAINE 5 % EX PTCH
1.0000 | MEDICATED_PATCH | CUTANEOUS | Status: DC
  Administered 2020-03-30: 1 via TRANSDERMAL
  Filled 2020-03-30: qty 1

## 2020-03-30 MED ORDER — CEPHALEXIN 250 MG PO CAPS
500.0000 mg | ORAL_CAPSULE | Freq: Once | ORAL | Status: AC
  Administered 2020-03-30: 500 mg via ORAL
  Filled 2020-03-30: qty 2

## 2020-03-30 NOTE — ED Notes (Signed)
Patient transported to CT 

## 2020-03-30 NOTE — ED Triage Notes (Signed)
Pt c/o left lower abd pain 7/10 comes and goes for one week. Lower back pain, and nausea and vomiting (x4)  started this AM ~0300.

## 2020-03-30 NOTE — Discharge Instructions (Signed)
You were seen in the emergency department today for your abdominal pain and back pain.  Your blood work, CT scan, and vital signs are very reassuring. Your urine appears infected.  You prescribed antibiotic to treat urinary tract infection.  You should follow-up with your primary care doctor for recheck of your urine to confirm resolution of your infection.  Regarding your abdominal pain, while exact source of your pain remains unclear, there does not appear to be any emergent cause at this time.  Regarding your low back pain, the muscles in your low back are in what is called spasm, meaning they are inappropriately tightened up.  This can be quite painful.  To help with your pain you may take Tylenol and / or NSAID medication (such as ibuprofen or naproxen) to help with your pain.  Additionally, you have been prescribed a muscle relaxer called Robaxin to help relieve some of the muscle spasm.  Please be advised that this medication may make you very sleepy, so you should not drive or operate heavy machinery while you are taking it.  You may also utilize topical pain relief such as Biofreeze, IcyHot, or topical lidocaine patches.  I also recommend that you apply heat to the area, such as a hot shower or heating pad, and follow heat application with massage of the muscles that are most tight.  Please return to the emergency department if you develop any numbness/tingling/weakness in your arms or legs, any difficulty urinating, or urinary incontinence chest pain, shortness of breath, abdominal pain, nausea or vomiting that does not stop, or any other new severe symptoms.

## 2020-03-30 NOTE — ED Provider Notes (Signed)
[facility] Provider Note   CSN: [id number] Arrival date & time: 03/30/20  1006     History Chief Complaint  Patient presents with  . Abdominal Pain    Julia Roth is a 30 y.o. female who presents concern for 1 week of constant left lower abdominal pain rated a 7/10, as well as intermittent bilateral low back pain.  She endorses 4 episodes of NBNB emesis this morning.  Additionally patient feels that her abdomen has been growing in size since January.  Last menstrual period is 01/31/2020.  Patient is not currently on any birth control, and she is sexually active in a monogamous relationship with her female partner of 5 years.  She denies any vaginal bleeding, discharge, or skin changes.  She denies any concern for exposure to sexually transmitted infection.  She denies diarrhea, states most recent bowel movement was yesterday, denies melena or hematochezia.  She denies any chest pain, shortness of breath, palpitations. She also endorses urinary frequency and urgency, but denies dysuria or hematuria.  Back pain is bilateral, L>R.  Without radiation of pain, not relieved by OTC analgesia at home.  I personally read this patient's medical records.  She is history of hypertension during pregnancy, as well as cervical cancer.  She is not currently on any medications every day.    HPI     Past Medical History:  Diagnosis Date  . Cervical cancer ([place])   . Pregnancy induced hypertension   . Pregnant   . Trichomonas infection     There are no problems to display for this patient.   No past surgical history on file.   OB History    Gravida  5   Para  3   Term      Preterm      AB  1   Living  3     SAB  1   IAB      Ectopic      Multiple      Live Births              No family history on file.  Social History   Tobacco Use  . Smoking status: Never Smoker  Vaping Use  . Vaping Use: Never used  Substance Use Topics  .  Alcohol use: No  . Drug use: No    Home Medications Prior to Admission medications   Medication Sig Start Date End Date Taking? Authorizing Provider  cephALEXin (KEFLEX) 500 MG capsule Take 1 capsule (500 mg total) by mouth 2 (two) times daily for 7 days. 03/30/20 04/06/20 Yes Kasiyah Platter, Gypsy Balsam, PA-C  methocarbamol (ROBAXIN) 500 MG tablet Take 1 tablet (500 mg total) by mouth 2 (two) times daily. 03/30/20  Yes Bain Whichard, Gypsy Balsam, PA-C  Prenatal Vit-Fe Fumarate-FA (PRENATAL MULTIVITAMIN) TABS tablet Take 1 tablet by mouth daily at 12 noon.     [provider]    Allergies    Mushroom extract complex  Review of Systems   Review of Systems  Constitutional: Negative for activity change, appetite change, chills, diaphoresis, fatigue and fever.  HENT: Negative.   Eyes: Negative.   Respiratory: Negative.  Negative for shortness of breath.   Cardiovascular: Negative.  Negative for chest pain, palpitations and leg swelling.  Gastrointestinal: Positive for abdominal pain, nausea and vomiting. Negative for blood in stool, constipation and diarrhea.  Genitourinary: Positive for flank pain, frequency, menstrual problem and urgency. Negative for decreased urine volume, difficulty urinating, dyspareunia, dysuria,  enuresis, genital sores, hematuria, pelvic pain, vaginal bleeding, vaginal discharge and vaginal pain.  Musculoskeletal: Positive for back pain. Negative for gait problem.  Skin: Negative.   Neurological: Negative.     Physical Exam Updated Vital Signs BP 132/85   Pulse 77   Temp 98.2 F (36.8 C)   Resp 16   Ht 5\' 4"  (1.626 m)   Wt 72.2 kg   LMP 01/31/2020   SpO2 100%   BMI 27.30 kg/m   Physical Exam Vitals and nursing note reviewed.  Constitutional:      Appearance: She is overweight. She is not ill-appearing.  HENT:     Head: Normocephalic and atraumatic.     Mouth/Throat:     Mouth: Mucous membranes are moist.     Pharynx: Oropharynx is clear. Uvula midline.  No oropharyngeal exudate or posterior oropharyngeal erythema.  Eyes:     General:        Right eye: No discharge.        Left eye: No discharge.     Extraocular Movements: Extraocular movements intact.     Conjunctiva/sclera: Conjunctivae normal.     Pupils: Pupils are equal, round, and reactive to light.  Neck:     Trachea: Trachea and phonation normal.  Cardiovascular:     Rate and Rhythm: Normal rate and regular rhythm.     Pulses: Normal pulses.          Radial pulses are 2+ on the right side and 2+ on the left side.       Dorsalis pedis pulses are 2+ on the right side and 2+ on the left side.     Heart sounds: Normal heart sounds. No murmur heard.   Pulmonary:     Effort: Pulmonary effort is normal. No respiratory distress.     Breath sounds: Normal breath sounds. No wheezing or rales.  Chest:     Chest wall: No deformity, swelling, tenderness, crepitus or edema.  Abdominal:     General: Bowel sounds are normal. There is no distension.     Palpations: Abdomen is soft.     Tenderness: There is abdominal tenderness in the left upper quadrant and left lower quadrant. There is left CVA tenderness. There is no right CVA tenderness, guarding or rebound.  Musculoskeletal:        General: No deformity.     Cervical back: Normal, normal range of motion and neck supple. No spasms, tenderness, bony tenderness or crepitus. No pain with movement or spinous process tenderness.     Thoracic back: Normal. No spasms, tenderness or bony tenderness.     Lumbar back: Spasms and tenderness present. No swelling, deformity or bony tenderness. Negative right straight leg raise test and negative left straight leg raise test.     Right lower leg: No edema.     Left lower leg: No edema.  Lymphadenopathy:     Cervical: No cervical adenopathy.  Skin:    General: Skin is warm and dry.     Capillary Refill: Capillary refill takes less than 2 seconds.  Neurological:     General: No focal deficit  present.     Mental Status: She is alert and oriented to person, place, and time. Mental status is at baseline.     Sensory: Sensation is intact.     Motor: Motor function is intact.     Gait: Gait is intact.  Psychiatric:        Mood and Affect: Mood normal.  ED Results / Procedures / Treatments   Labs (all labs ordered are listed, but only abnormal results are displayed) Labs Reviewed  COMPREHENSIVE METABOLIC PANEL - Abnormal; Notable for the following components:      Result Value   Glucose, Bld 100 (*)    Alkaline Phosphatase 34 (*)    All other components within normal limits  URINALYSIS, ROUTINE W REFLEX MICROSCOPIC - Abnormal; Notable for the following components:   APPearance HAZY (*)    Leukocytes,Ua SMALL (*)    All other components within normal limits  URINALYSIS, MICROSCOPIC (REFLEX) - Abnormal; Notable for the following components:   Bacteria, UA MANY (*)    All other components within normal limits  LIPASE, BLOOD  CBC  PREGNANCY, URINE    EKG EKG: normal sinus rhythm, no STEMI.  Radiology CT Abdomen Pelvis W Contrast  Result Date: 03/30/2020 CLINICAL DATA:  Left lower abdominal pain EXAM: CT ABDOMEN AND PELVIS WITH CONTRAST TECHNIQUE: Multidetector CT imaging of the abdomen and pelvis was performed using the standard protocol following bolus administration of intravenous contrast. CONTRAST:  132mL OMNIPAQUE IOHEXOL 300 MG/ML  SOLN COMPARISON:  06/17/2017 FINDINGS: Lower chest: No pleural or pericardial effusion. Hepatobiliary: No focal liver abnormality is seen. No gallstones, gallbladder wall thickening, or biliary dilatation. Pancreas: Unremarkable. No pancreatic ductal dilatation or surrounding inflammatory changes. Spleen: Normal in size without focal abnormality. Adrenals/Urinary Tract: Adrenal glands are unremarkable. Kidneys are normal, without renal calculi, focal lesion, or hydronephrosis. Bladder is unremarkable. Stomach/Bowel: Stomach is incompletely  distended, unremarkable. Small bowel decompressed. Appendix not discretely identified. The colon is nondilated. Vascular/Lymphatic: No significant vascular findings are present. No enlarged abdominal or pelvic lymph nodes. Reproductive: Uterus and bilateral adnexa are unremarkable. Other: Bilateral pelvic phleboliths.  No ascites.  No free air. Musculoskeletal: No acute or significant osseous findings. IMPRESSION: No acute findings. Electronically Signed   By: Lucrezia Europe M.D.   On: 03/30/2020 12:57    Procedures Procedures   Medications Ordered in ED Medications  lidocaine (LIDODERM) 5 % 1 patch (1 patch Transdermal Patch Applied 03/30/20 1404)  iohexol (OMNIPAQUE) 300 MG/ML solution 100 mL (100 mLs Intravenous Contrast Given 03/30/20 1246)  cephALEXin (KEFLEX) capsule 500 mg (500 mg Oral Given 03/30/20 1405)    ED Course  I have reviewed the triage vital signs and the nursing notes.  Pertinent labs & imaging results that were available during my care of the patient were reviewed by me and considered in my medical decision making (see chart for details).    MDM Rules/Calculators/A&P                         30 year old female presents with concern for 1 week of left lower abdominal pain and bilateral back pain.  Concerned she may be pregnant, associated urinary symptoms without vaginal symptoms or pelvic pain.  Vital signs are normal on intake.  Cardiopulmonary exam is normal, abdominal exam revealed left upper and lower quadrant tenderness to palpation as well suprapubic tenderness to palpation without rebound or guarding.  There is left-sided CVA tenderness as well.  Bilateral lumbar paraspinous muscular spasm, L>R.  Neurovascularly intact in all 4 extremities. No skin changes suggestive of herpes zoster.   Differential diagnosis for the patient's left-sided abdominal pain includes but is not limited to diverticulitis, nephrolithiasis/ureterolithiasis, pyelonephritis, ectopic pregnancy, IBD,  infectious colitis, mesenteric ischemia, ovarian torsion, PID, psoas abscess, AAA endometriosis, colon cancer, bowel obstruction.  CBC unremarkable, CMP unremarkable, lipase normal, patient  is not pregnant.  UA with small leukocytes with many bacteria and mucus.  Will proceed with CT abdomen pelvis at this time.  CT AP negative for acute abnormality. Patient reevaluated with the improvement in her pain at this time without analgesia.  Given reassuring physical exam, vital signs, laboratory studies and imaging studies no further work-up is warranted in the ED at this time.  Will treat patient for urinary symptoms given possible early infection on her UA.  Recommend close follow-up with her primary care doctor.  While exact etiology for patient's left-sided abdominal pain remains unclear, there does not appear to be any emergent cause of her symptoms.  Regarding her bilateral lumbar pain, patient has bilateral lumbar spasm without concern for cord compression.  Will discharge with course of Robaxin.  Isadora voiced understanding of her medical evaluation and treatment plan.  Each of her questions was answered to her expressed satisfaction.  Return precautions are given.  Patient is well-appearing, stable, and appropriate for discharge at this time.  This chart was dictated using voice recognition software, Dragon. Despite the best efforts of this provider to proofread and correct errors, errors may still occur which can change documentation meaning.  Final Clinical Impression(s) / ED Diagnoses Final diagnoses:  Left lower quadrant abdominal pain  Lower urinary tract infectious disease    Rx / DC Orders ED Discharge Orders         Ordered    methocarbamol (ROBAXIN) 500 MG tablet  2 times daily        03/30/20 1345    cephALEXin (KEFLEX) 500 MG capsule  2 times daily        03/30/20 [address] Gypsy Balsam, PA-C 03/30/20 1711    Davonna Belling, MD 03/31/20 1525

## 2020-03-30 NOTE — ED Notes (Signed)
Pt discharged to home. Discharge instructions have been discussed with patient and/or family members. Pt verbally acknowledges understanding d/c instructions, and endorses comprehension to checkout at registration before leaving.  °

## 2020-10-01 ENCOUNTER — Emergency Department (HOSPITAL_BASED_OUTPATIENT_CLINIC_OR_DEPARTMENT_OTHER)
Admission: EM | Admit: 2020-10-01 | Discharge: 2020-10-01 | Disposition: A | Discharge status: Home / Self Care | Payer: Self-pay | Attending: Emergency Medicine | Admitting: Emergency Medicine

## 2020-10-01 ENCOUNTER — Other Ambulatory Visit: Payer: Self-pay

## 2020-10-01 ENCOUNTER — Encounter (HOSPITAL_BASED_OUTPATIENT_CLINIC_OR_DEPARTMENT_OTHER): Payer: Self-pay | Admitting: *Deleted

## 2020-10-01 ENCOUNTER — Emergency Department (HOSPITAL_BASED_OUTPATIENT_CLINIC_OR_DEPARTMENT_OTHER): Payer: Self-pay

## 2020-10-01 DIAGNOSIS — R110 Nausea: Secondary | ICD-10-CM | POA: Insufficient documentation

## 2020-10-01 DIAGNOSIS — Z8541 Personal history of malignant neoplasm of cervix uteri: Secondary | ICD-10-CM | POA: Insufficient documentation

## 2020-10-01 DIAGNOSIS — R079 Chest pain, unspecified: Secondary | ICD-10-CM

## 2020-10-01 DIAGNOSIS — R0789 Other chest pain: Secondary | ICD-10-CM | POA: Insufficient documentation

## 2020-10-01 LAB — CBC WITH DIFFERENTIAL/PLATELET
Abs Immature Granulocytes: 0.02 10*3/uL (ref 0.00–0.07)
Basophils Absolute: 0 10*3/uL (ref 0.0–0.1)
Basophils Relative: 1 %
Eosinophils Absolute: 0.4 10*3/uL (ref 0.0–0.5)
Eosinophils Relative: 5 %
HCT: 39.7 % (ref 36.0–46.0)
Hemoglobin: 13.3 g/dL (ref 12.0–15.0)
Immature Granulocytes: 0 %
Lymphocytes Relative: 27 %
Lymphs Abs: 2 10*3/uL (ref 0.7–4.0)
MCH: 30.6 pg (ref 26.0–34.0)
MCHC: 33.5 g/dL (ref 30.0–36.0)
MCV: 91.3 fL (ref 80.0–100.0)
Monocytes Absolute: 0.7 10*3/uL (ref 0.1–1.0)
Monocytes Relative: 9 %
Neutro Abs: 4.3 10*3/uL (ref 1.7–7.7)
Neutrophils Relative %: 58 %
Platelets: 160 10*3/uL (ref 150–400)
RBC: 4.35 MIL/uL (ref 3.87–5.11)
RDW: 16.1 % — ABNORMAL HIGH (ref 11.5–15.5)
WBC: 7.4 10*3/uL (ref 4.0–10.5)
nRBC: 0 % (ref 0.0–0.2)

## 2020-10-01 LAB — BASIC METABOLIC PANEL
Anion gap: 6 (ref 5–15)
BUN: 13 mg/dL (ref 6–20)
CO2: 26 mmol/L (ref 22–32)
Calcium: 9 mg/dL (ref 8.9–10.3)
Chloride: 105 mmol/L (ref 98–111)
Creatinine, Ser: 0.81 mg/dL (ref 0.44–1.00)
GFR, Estimated: >60 mL/min (ref >60)
Glucose, Bld: 83 mg/dL (ref 70–99)
Potassium: 3.9 mmol/L (ref 3.5–5.1)
Sodium: 137 mmol/L (ref 135–145)

## 2020-10-01 LAB — HEPATIC FUNCTION PANEL
ALT: 13 U/L (ref 0–44)
AST: 17 U/L (ref 15–41)
Albumin: 3.9 g/dL (ref 3.5–5.0)
Alkaline Phosphatase: 38 U/L (ref 38–126)
Bilirubin, Direct: <0.1 mg/dL (ref 0.0–0.2)
Total Bilirubin: 0.3 mg/dL (ref 0.3–1.2)
Total Protein: 6.3 g/dL — ABNORMAL LOW (ref 6.5–8.1)

## 2020-10-01 LAB — LIPASE, BLOOD: Lipase: 23 U/L (ref 11–51)

## 2020-10-01 LAB — TROPONIN I (HIGH SENSITIVITY): Troponin I (High Sensitivity): <2 ng/L (ref <18)

## 2020-10-01 LAB — PREGNANCY, URINE: Preg Test, Ur: NEGATIVE

## 2020-10-01 MED ORDER — KETOROLAC TROMETHAMINE 15 MG/ML IJ SOLN
15.0000 mg | Freq: Once | INTRAMUSCULAR | Status: AC
  Administered 2020-10-01: 15 mg via INTRAVENOUS
  Filled 2020-10-01: qty 1

## 2020-10-01 MED ORDER — NAPROXEN 500 MG PO TABS: 500.0000 mg | ORAL_TABLET | Freq: Two times a day (BID) | ORAL | 0 refills | Status: AC

## 2020-10-01 NOTE — ED Triage Notes (Signed)
Stabbing pain from her left chest to her right chest all day. Pain comes and goes. Epigastric pain. She is guarding her upper abdomen. EKG at triage.

## 2020-10-01 NOTE — ED Provider Notes (Signed)
[facility] Provider Note   CSN: [id number] Arrival date & time: 10/01/20  1323     History Chief Complaint  Patient presents with   Chest Pain    Julia Roth is a 30 y.o. female.  No notable past medical history.  Patient presents with chest pain since 830 this morning.  Patient states been constant and slowly worsening.  Says that it is in the left side of her chest but has migrated over to the right side.  She describes it as pressure feeling.  She says it is worse when she takes a deep breath and lays on her left side.  Never had this pain before.  Aleve has not helped her pain.  She denies any abdominal pain, vomiting, constipation, diarrhea, fever, chills, shortness of breath.  She is on Depo-Provera.  She states that she is not pregnant at this time.   Chest Pain Associated symptoms: nausea   Associated symptoms: no abdominal pain, no back pain, no cough, no dizziness, no fever, no headache, no palpitations, no shortness of breath, no vomiting and no weakness       Past Medical History:  Diagnosis Date   Cervical cancer ([place])    Pregnancy induced hypertension    Pregnant    Trichomonas infection     There are no problems to display for this patient.   History reviewed. No pertinent surgical history.   OB History     Gravida  5   Para  3   Term      Preterm      AB  1   Living  3      SAB  1   IAB      Ectopic      Multiple      Live Births              History reviewed. No pertinent family history.  Social History   Tobacco Use   Smoking status: Never   Smokeless tobacco: Never  Vaping Use   Vaping Use: Never used  Substance Use Topics   Alcohol use: No   Drug use: No    Home Medications Prior to Admission medications   Medication Sig Start Date End Date Taking? Authorizing Provider  naproxen (NAPROSYN) 500 MG tablet Take 1 tablet (500 mg total) by mouth 2 (two) times daily for 14 days.  10/01/20 10/15/20 Yes Dia Donate, Adora Fridge, PA-C  methocarbamol (ROBAXIN) 500 MG tablet Take 1 tablet (500 mg total) by mouth 2 (two) times daily. 03/30/20   Sponseller, Gypsy Balsam, PA-C  Prenatal Vit-Fe Fumarate-FA (PRENATAL MULTIVITAMIN) TABS tablet Take 1 tablet by mouth daily at 12 noon.     [provider]    Allergies    Mushroom extract complex  Review of Systems   Review of Systems  Constitutional:  Negative for chills and fever.  HENT:  Negative for congestion and rhinorrhea.   Eyes:  Negative for visual disturbance.  Respiratory:  Negative for cough, chest tightness and shortness of breath.   Cardiovascular:  Positive for chest pain. Negative for palpitations and leg swelling.  Gastrointestinal:  Positive for nausea. Negative for abdominal pain, constipation, diarrhea and vomiting.  Genitourinary:  Negative for difficulty urinating.  Musculoskeletal:  Negative for back pain.  Skin:  Negative for rash and wound.  Neurological:  Negative for dizziness, syncope, weakness, light-headedness and headaches.  All other systems reviewed and are negative.  Physical Exam Updated Vital Signs BP  111/73 (BP Location: Right Arm)   Pulse 66   Temp 98.7 F (37.1 C) (Oral)   Resp 12   Ht '5\' 4"'$  (1.626 m)   Wt 75.4 kg   LMP 08/11/2020   SpO2 100%   BMI 28.53 kg/m   Physical Exam Vitals and nursing note reviewed.  Constitutional:      General: She is not in acute distress.    Appearance: Normal appearance. She is not ill-appearing, toxic-appearing or diaphoretic.  HENT:     Head: Normocephalic and atraumatic.     Mouth/Throat:     Mouth: Mucous membranes are moist.     Pharynx: Oropharynx is clear. No oropharyngeal exudate or posterior oropharyngeal erythema.  Eyes:     General: No scleral icterus.       Right eye: No discharge.        Left eye: No discharge.     Conjunctiva/sclera: Conjunctivae normal.     Pupils: Pupils are equal, round, and reactive to light.   Cardiovascular:     Rate and Rhythm: Normal rate and regular rhythm.     Pulses: Normal pulses.     Heart sounds: Normal heart sounds, S1 normal and S2 normal. No murmur heard.   No friction rub. No gallop.  Pulmonary:     Effort: Pulmonary effort is normal. No respiratory distress.     Breath sounds: Normal breath sounds. No wheezing, rhonchi or rales.  Chest:     Comments: Reproducible chest wall tenderness to palpation Abdominal:     General: Abdomen is flat. Bowel sounds are normal. There is no distension.     Palpations: Abdomen is soft. There is no pulsatile mass.     Tenderness: There is no abdominal tenderness. There is no guarding or rebound.  Musculoskeletal:     Right lower leg: No edema.     Left lower leg: No edema.  Skin:    General: Skin is warm and dry.     Coloration: Skin is not jaundiced.     Findings: No bruising, erythema, lesion or rash.  Neurological:     General: No focal deficit present.     Mental Status: She is alert and oriented to person, place, and time.  Psychiatric:        Mood and Affect: Mood normal.        Behavior: Behavior normal.    ED Results / Procedures / Treatments   Labs (all labs ordered are listed, but only abnormal results are displayed) Labs Reviewed  CBC WITH DIFFERENTIAL/PLATELET - Abnormal; Notable for the following components:      Result Value   RDW 16.1 (*)    All other components within normal limits  HEPATIC FUNCTION PANEL - Abnormal; Notable for the following components:   Total Protein 6.3 (*)    All other components within normal limits  BASIC METABOLIC PANEL  LIPASE, BLOOD  PREGNANCY, URINE  TROPONIN I (HIGH SENSITIVITY)  TROPONIN I (HIGH SENSITIVITY)    EKG EKG Interpretation  Date/Time:  Tuesday October 01 2020 13:32:44 EDT Ventricular Rate:  77 PR Interval:  170 QRS Duration: 80 QT Interval:  352 QTC Calculation: 398 R Axis:   78 Text Interpretation: Normal sinus rhythm Normal ECG Confirmed by  Lennice Sites ([id number]) on 10/01/2020 1:47:35 PM  Radiology DG Chest 2 View  Result Date: 10/01/2020 CLINICAL DATA:  Chest pain EXAM: CHEST - 2 VIEW COMPARISON:  10/06/2018 FINDINGS: The heart size and mediastinal contours are within normal limits. Both  lungs are clear. The visualized skeletal structures are unremarkable. IMPRESSION: No active cardiopulmonary disease. Electronically Signed   By: Eddie Candle M.D.   On: 10/01/2020 15:20    Procedures Procedures   Medications Ordered in ED Medications  ketorolac (TORADOL) 15 MG/ML injection 15 mg (15 mg Intravenous Given 10/01/20 1536)    ED Course  I have reviewed the triage vital signs and the nursing notes.  Pertinent labs & imaging results that were available during my care of the patient were reviewed by me and considered in my medical decision making (see chart for details).    MDM Rules/Calculators/A&P                         Patient is a well appearing 30 year old female who presents with chest pain since this morning.  Her vitals are overall unremarkable.  Exam is notable for reproducible chest wall tenderness to palpation.  Cardiac work-up was largely unremarkable.  Chest x-ray was normal.  Her troponin was negative.  Urine pregnancy is negative.She is PERC 0.  I have low suspicion for cardiac cause of patient's pain.  I suspect that patient's pain is related to costochondritis.  She was given a prescription for naproxen to take home.  She should schedule appointment with her PCP as soon as possible.  Final Clinical Impression(s) / ED Diagnoses Final diagnoses:  Nonspecific chest pain    Rx / DC Orders ED Discharge Orders          Ordered    naproxen (NAPROSYN) 500 MG tablet  2 times daily        10/01/20 1602             Adolphus Birchwood, PA-C 10/01/20 [place], [place], DO 10/02/20 [phone]

## 2020-10-01 NOTE — ED Notes (Signed)
Pt medicated for pain 8/10. Pt ambulated to bathroom with steady gait.

## 2020-10-01 NOTE — Discharge Instructions (Addendum)
You have been seen in the ED for chest pain. Your labs and imaging returned normal. I have prescribed you Naproxen. Please take this for two weeks or until your chest pain resolves. Please make an appointment with your PCP for further management and work up of your symptoms.

## 2020-12-18 ENCOUNTER — Other Ambulatory Visit: Payer: Self-pay

## 2020-12-18 ENCOUNTER — Emergency Department (HOSPITAL_BASED_OUTPATIENT_CLINIC_OR_DEPARTMENT_OTHER)
Admission: EM | Admit: 2020-12-18 | Discharge: 2020-12-18 | Disposition: A | Discharge status: Home / Self Care | Payer: Medicaid Other | Attending: Emergency Medicine | Admitting: Emergency Medicine

## 2020-12-18 DIAGNOSIS — Z20822 Contact with and (suspected) exposure to covid-19: Secondary | ICD-10-CM | POA: Insufficient documentation

## 2020-12-18 DIAGNOSIS — J101 Influenza due to other identified influenza virus with other respiratory manifestations: Secondary | ICD-10-CM | POA: Insufficient documentation

## 2020-12-18 DIAGNOSIS — R6889 Other general symptoms and signs: Secondary | ICD-10-CM

## 2020-12-18 DIAGNOSIS — Z8541 Personal history of malignant neoplasm of cervix uteri: Secondary | ICD-10-CM | POA: Insufficient documentation

## 2020-12-18 LAB — RESP PANEL BY RT-PCR (FLU A&B, COVID) ARPGX2
Influenza A by PCR: NEGATIVE
Influenza B by PCR: NEGATIVE
SARS Coronavirus 2 by RT PCR: NEGATIVE

## 2020-12-18 MED ORDER — ONDANSETRON 4 MG PO TBDP: 4.0000 mg | ORAL_TABLET | Freq: Three times a day (TID) | ORAL | 1 refills | Status: DC | PRN

## 2020-12-18 MED ORDER — DOXYCYCLINE HYCLATE 100 MG PO CAPS: 100.0000 mg | ORAL_CAPSULE | Freq: Two times a day (BID) | ORAL | 0 refills | Status: DC

## 2020-12-18 NOTE — ED Notes (Signed)
D/c paperwork reviewed with pt, including prescriptions and work note. Pt with no further questions or concerns at time of d/c. Ambulatory to ED exit on RA, NAD.

## 2020-12-18 NOTE — ED Provider Notes (Addendum)
[facility] EMERGENCY DEPARTMENT Provider Note   CSN: [id number] Arrival date & time: 12/18/20  0831     History Chief Complaint  Patient presents with   Nasal Congestion    Julia Roth is a 30 y.o. female.  Patient with a complaint of nasal congestion and productive cough some posttussive emesis.  Patient's fluid intake is good.  Denies any diarrhea.  Patient's children had cold type symptoms a week ago but did not seem to be that sick with it.  Patient is feeling some occasional nausea.  No sore throat.  Also complaining of pain in the left nares.  She thinks there is like a cyst or boil there.      Past Medical History:  Diagnosis Date   Cervical cancer ([place])    Pregnancy induced hypertension    Pregnant    Trichomonas infection     There are no problems to display for this patient.   No past surgical history on file.   OB History     Gravida  5   Para  3   Term      Preterm      AB  1   Living  3      SAB  1   IAB      Ectopic      Multiple      Live Births              No family history on file.  Social History   Tobacco Use   Smoking status: Never   Smokeless tobacco: Never  Vaping Use   Vaping Use: Never used  Substance Use Topics   Alcohol use: No   Drug use: No    Home Medications Prior to Admission medications   Medication Sig Start Date End Date Taking? Authorizing Provider  doxycycline (VIBRAMYCIN) 100 MG capsule Take 1 capsule (100 mg total) by mouth 2 (two) times daily. 12/18/20  Yes Fredia Sorrow, MD  ondansetron (ZOFRAN-ODT) 4 MG disintegrating tablet Take 1 tablet (4 mg total) by mouth every 8 (eight) hours as needed for nausea or vomiting. 12/18/20  Yes Fredia Sorrow, MD  methocarbamol (ROBAXIN) 500 MG tablet Take 1 tablet (500 mg total) by mouth 2 (two) times daily. 03/30/20   Sponseller, Gypsy Balsam, PA-C  Prenatal Vit-Fe Fumarate-FA (PRENATAL MULTIVITAMIN) TABS tablet Take 1 tablet by mouth  daily at 12 noon.     [provider]    Allergies    Mushroom extract complex  Review of Systems   Review of Systems  Constitutional:  Positive for fatigue. Negative for chills and fever.  HENT:  Positive for congestion. Negative for ear pain and sore throat.   Eyes:  Negative for pain and visual disturbance.  Respiratory:  Positive for cough. Negative for shortness of breath.   Cardiovascular:  Negative for chest pain and palpitations.  Gastrointestinal:  Positive for nausea. Negative for abdominal pain and vomiting.  Genitourinary:  Negative for dysuria and hematuria.  Musculoskeletal:  Negative for arthralgias and back pain.  Skin:  Negative for color change and rash.  Neurological:  Negative for seizures and syncope.  All other systems reviewed and are negative.  Physical Exam Updated Vital Signs BP 134/84 (BP Location: Right Arm)   Pulse 86   Temp 98.1 F (36.7 C) (Oral)   Resp 15   Ht 1.626 m (5\' 4" )   Wt 70.8 kg   LMP 12/13/2020   SpO2 100%   BMI  26.78 kg/m   Physical Exam Vitals and nursing note reviewed.  Constitutional:      General: She is not in acute distress.    Appearance: Normal appearance. She is well-developed.  HENT:     Head: Normocephalic and atraumatic.     Nose:     Comments: Left nares with some swelling on the septum area.  No evidence of any distinct abscess.  Right nares clear.  No bleeding.    Mouth/Throat:     Mouth: Mucous membranes are moist.     Comments: Uvula midline mild erythema no exudate Eyes:     Extraocular Movements: Extraocular movements intact.     Conjunctiva/sclera: Conjunctivae normal.     Pupils: Pupils are equal, round, and reactive to light.  Cardiovascular:     Rate and Rhythm: Normal rate and regular rhythm.     Heart sounds: No murmur heard. Pulmonary:     Effort: Pulmonary effort is normal. No respiratory distress.     Breath sounds: Normal breath sounds. No wheezing, rhonchi or rales.  Abdominal:      Palpations: Abdomen is soft.     Tenderness: There is no abdominal tenderness.  Musculoskeletal:        General: No swelling.     Cervical back: Neck supple.  Skin:    General: Skin is warm and dry.     Capillary Refill: Capillary refill takes less than 2 seconds.  Neurological:     General: No focal deficit present.     Mental Status: She is alert.     Cranial Nerves: No cranial nerve deficit.     Sensory: No sensory deficit.     Motor: No weakness.  Psychiatric:        Mood and Affect: Mood normal.    ED Results / Procedures / Treatments   Labs (all labs ordered are listed, but only abnormal results are displayed) Labs Reviewed  RESP PANEL BY RT-PCR (FLU A&B, COVID) [id number]    EKG None  Radiology No results found.  Procedures Procedures   Medications Ordered in ED Medications - No data to display  ED Course  I have reviewed the triage vital signs and the nursing notes.  Pertinent labs & imaging results that were available during my care of the patient were reviewed by me and considered in my medical decision making (see chart for details).    MDM Rules/Calculators/A&P                           Patient's symptoms consistent with flulike illness.  Will COVID and flu test.  Have patient follow-up on MyChart.  We will also treat with doxycycline for possible skin abscess.  And will give Zofran for nausea.  Otherwise recommend over-the-counter cold and flu medicines.  His lungs are clear.  Vital signs nontoxic no acute distress.   Final Clinical Impression(s) / ED Diagnoses Final diagnoses:  Flu-like symptoms    Rx / DC Orders ED Discharge Orders          Ordered    ondansetron (ZOFRAN-ODT) 4 MG disintegrating tablet  Every 8 hours PRN        12/18/20 0947    doxycycline (VIBRAMYCIN) 100 MG capsule  2 times daily        12/18/20 0947             Fredia Sorrow, MD 12/18/20 3295    Fredia Sorrow, MD 12/18/20 [id number]

## 2020-12-18 NOTE — Discharge Instructions (Signed)
Zofran as needed for the nausea.  Take the doxycycline antibiotic as directed for the cyst in the left nose area.  Turn for any new or worse symptoms.  Recommend over-the-counter cold and flu medication.  Follow-up your COVID flu testing on MyChart.  Work note provided.

## 2020-12-18 NOTE — ED Notes (Signed)
ED Provider at bedside. 

## 2020-12-18 NOTE — ED Triage Notes (Addendum)
Pt POV reports nasal congestion, mucous which is making her nauseas. Reports good PO intake, denies diarrhea.   Pt reports her kids are home with colds, denies fevers.

## 2021-02-06 ENCOUNTER — Emergency Department (HOSPITAL_BASED_OUTPATIENT_CLINIC_OR_DEPARTMENT_OTHER): Payer: Medicaid Other

## 2021-02-06 ENCOUNTER — Other Ambulatory Visit: Payer: Self-pay

## 2021-02-06 ENCOUNTER — Emergency Department (HOSPITAL_BASED_OUTPATIENT_CLINIC_OR_DEPARTMENT_OTHER)
Admission: EM | Admit: 2021-02-06 | Discharge: 2021-02-06 | Disposition: A | Discharge status: Home / Self Care | Payer: Medicaid Other | Attending: Emergency Medicine | Admitting: Emergency Medicine

## 2021-02-06 ENCOUNTER — Encounter (HOSPITAL_BASED_OUTPATIENT_CLINIC_OR_DEPARTMENT_OTHER): Payer: Self-pay

## 2021-02-06 DIAGNOSIS — R102 Pelvic and perineal pain: Secondary | ICD-10-CM | POA: Diagnosis not present

## 2021-02-06 DIAGNOSIS — O26851 Spotting complicating pregnancy, first trimester: Secondary | ICD-10-CM | POA: Insufficient documentation

## 2021-02-06 DIAGNOSIS — Z3A01 Less than 8 weeks gestation of pregnancy: Secondary | ICD-10-CM | POA: Diagnosis not present

## 2021-02-06 DIAGNOSIS — O3680X0 Pregnancy with inconclusive fetal viability, not applicable or unspecified: Secondary | ICD-10-CM | POA: Diagnosis not present

## 2021-02-06 DIAGNOSIS — N939 Abnormal uterine and vaginal bleeding, unspecified: Secondary | ICD-10-CM

## 2021-02-06 DIAGNOSIS — O4690 Antepartum hemorrhage, unspecified, unspecified trimester: Secondary | ICD-10-CM

## 2021-02-06 LAB — URINALYSIS, ROUTINE W REFLEX MICROSCOPIC
Bilirubin Urine: NEGATIVE
Glucose, UA: NEGATIVE mg/dL
Hgb urine dipstick: NEGATIVE
Ketones, ur: NEGATIVE mg/dL
Nitrite: NEGATIVE
Protein, ur: NEGATIVE mg/dL
Specific Gravity, Urine: 1.025 (ref 1.005–1.030)
pH: 6.5 (ref 5.0–8.0)

## 2021-02-06 LAB — CBC WITH DIFFERENTIAL/PLATELET
Abs Immature Granulocytes: 0.01 10*3/uL (ref 0.00–0.07)
Basophils Absolute: 0.1 10*3/uL (ref 0.0–0.1)
Basophils Relative: 1 %
Eosinophils Absolute: 0.5 10*3/uL (ref 0.0–0.5)
Eosinophils Relative: 7 %
HCT: 37.9 % (ref 36.0–46.0)
Hemoglobin: 12.8 g/dL (ref 12.0–15.0)
Immature Granulocytes: 0 %
Lymphocytes Relative: 33 %
Lymphs Abs: 2.3 10*3/uL (ref 0.7–4.0)
MCH: 30.5 pg (ref 26.0–34.0)
MCHC: 33.8 g/dL (ref 30.0–36.0)
MCV: 90.2 fL (ref 80.0–100.0)
Monocytes Absolute: 0.7 10*3/uL (ref 0.1–1.0)
Monocytes Relative: 11 %
Neutro Abs: 3.4 10*3/uL (ref 1.7–7.7)
Neutrophils Relative %: 48 %
Platelets: 184 10*3/uL (ref 150–400)
RBC: 4.2 MIL/uL (ref 3.87–5.11)
RDW: 15.9 % — ABNORMAL HIGH (ref 11.5–15.5)
WBC: 7 10*3/uL (ref 4.0–10.5)
nRBC: 0 % (ref 0.0–0.2)

## 2021-02-06 LAB — BASIC METABOLIC PANEL
Anion gap: 7 (ref 5–15)
BUN: 15 mg/dL (ref 6–20)
CO2: 22 mmol/L (ref 22–32)
Calcium: 9.1 mg/dL (ref 8.9–10.3)
Chloride: 106 mmol/L (ref 98–111)
Creatinine, Ser: 0.95 mg/dL (ref 0.44–1.00)
GFR, Estimated: >60 mL/min (ref >60)
Glucose, Bld: 90 mg/dL (ref 70–99)
Potassium: 3.9 mmol/L (ref 3.5–5.1)
Sodium: 135 mmol/L (ref 135–145)

## 2021-02-06 LAB — URINALYSIS, MICROSCOPIC (REFLEX)

## 2021-02-06 LAB — ABO/RH: ABO/RH(D): A POS

## 2021-02-06 LAB — HCG, QUANTITATIVE, PREGNANCY: hCG, Beta Chain, Quant, S: 504 m[IU]/mL — ABNORMAL HIGH (ref <5)

## 2021-02-06 NOTE — ED Provider Notes (Signed)
[facility] EMERGENCY DEPARTMENT Provider Note   CSN: [id number] Arrival date & time: 02/06/21  1537     History  Chief Complaint  Patient presents with   Vaginal Bleeding    Consepcion Roth is a 31 y.o. female with a past medical history of cervical cancer, pregnancy induced hypertension.  Presents to the emergency department with a chief complaint of vaginal bleeding and abdominal cramping.  Patient states that she is [redacted] weeks pregnant.  LMP 12/14/20.  Patient had positive urine pregnancy test on 1/10.  Patient states that she is G6 P4-0-1-4.  Patient had vaginal bleeding yesterday evening.  Patient states that bleeding saturated to pads.  Patient has not had any vaginal bleeding since then.  Patient states that she started having left lower quadrant abdominal cramping this morning at 7 AM.  Cramping has been intermittent over this time.  Cramping has gotten progressively worse.  At present patient rates pain 7/10 on the pain scale.  Patient took Tylenol at 0 700 with minimal improvement in her symptoms.  Patient states that pain is worse when sitting upright.  Patient denies any dysuria, urinary frequency/urgency, vaginal pain, vaginal discharge, nausea, vomiting, constipation, diarrhea, fever, chills.  Denies any history of abdominal surgeries.   Vaginal Bleeding Associated symptoms: abdominal pain   Associated symptoms: no back pain, no dizziness, no dysuria, no fever, no nausea and no vaginal discharge       Home Medications Prior to Admission medications   Medication Sig Start Date End Date Taking? Authorizing Provider  doxycycline (VIBRAMYCIN) 100 MG capsule Take 1 capsule (100 mg total) by mouth 2 (two) times daily. 12/18/20   Fredia Sorrow, MD  methocarbamol (ROBAXIN) 500 MG tablet Take 1 tablet (500 mg total) by mouth 2 (two) times daily. 03/30/20   Sponseller, Eugene Garnet R, PA-C  ondansetron (ZOFRAN-ODT) 4 MG disintegrating tablet Take 1 tablet (4 mg total) by  mouth every 8 (eight) hours as needed for nausea or vomiting. 12/18/20   Fredia Sorrow, MD  Prenatal Vit-Fe Fumarate-FA (PRENATAL MULTIVITAMIN) TABS tablet Take 1 tablet by mouth daily at 12 noon.     [provider]      Allergies    Mushroom extract complex    Review of Systems   Review of Systems  Constitutional:  Negative for chills and fever.  Eyes:  Negative for visual disturbance.  Respiratory:  Negative for shortness of breath.   Cardiovascular:  Negative for chest pain.  Gastrointestinal:  Positive for abdominal pain. Negative for abdominal distention, anal bleeding, blood in stool, constipation, diarrhea, nausea, rectal pain and vomiting.  Genitourinary:  Positive for pelvic pain and vaginal bleeding. Negative for difficulty urinating, dysuria, flank pain, frequency, genital sores, urgency, vaginal discharge and vaginal pain.  Musculoskeletal:  Negative for back pain and neck pain.  Skin:  Negative for color change and rash.  Neurological:  Negative for dizziness, syncope, light-headedness and headaches.  Psychiatric/Behavioral:  Negative for confusion.    Physical Exam Updated Vital Signs BP 129/75 (BP Location: Right Arm)    Pulse 93    Temp 98.5 F (36.9 C) (Oral)    Resp 18    Ht 5\' 4"  (1.626 m)    Wt 70.3 kg    LMP 12/13/2020    SpO2 100%    BMI 26.61 kg/m  Physical Exam Vitals and nursing note reviewed. Exam conducted with a chaperone present (Female RN present as chaperone).  Constitutional:      General: She is not  in acute distress.    Appearance: She is not ill-appearing, toxic-appearing or diaphoretic.  HENT:     Head: Normocephalic.  Eyes:     General: No scleral icterus.       Right eye: No discharge.        Left eye: No discharge.  Cardiovascular:     Rate and Rhythm: Normal rate.  Pulmonary:     Effort: Pulmonary effort is normal.  Abdominal:     General: Abdomen is flat. Bowel sounds are normal. There is no distension. There are no signs  of injury.     Palpations: Abdomen is soft. There is no mass or pulsatile mass.     Tenderness: There is no abdominal tenderness. There is no right CVA tenderness, left CVA tenderness, guarding or rebound.     Hernia: There is no hernia in the left inguinal area or right inguinal area.  Genitourinary:    Exam position: Lithotomy position.     Pubic Area: No rash or pubic lice.      Tanner stage (genital): 5.     Labia:        Right: No rash, tenderness, lesion or injury.        Left: No rash, tenderness, lesion or injury.      Vagina: No signs of injury and foreign body. No vaginal discharge, erythema, tenderness, bleeding or lesions.     Cervix: No cervical motion tenderness, discharge, friability, lesion, erythema, cervical bleeding or eversion.     Uterus: Not enlarged and not tender.      Adnexa: Right adnexa normal and left adnexa normal.     Comments: Cervical os is open Lymphadenopathy:     Lower Body: No right inguinal adenopathy. No left inguinal adenopathy.  Skin:    General: Skin is warm and dry.  Neurological:     General: No focal deficit present.     Mental Status: She is alert.  Psychiatric:        Behavior: Behavior is cooperative.    ED Results / Procedures / Treatments   Labs (all labs ordered are listed, but only abnormal results are displayed) Labs Reviewed  HCG, QUANTITATIVE, PREGNANCY - Abnormal; Notable for the following components:      Result Value   hCG, Beta Chain, Quant, S 504 (*)    All other components within normal limits  CBC WITH DIFFERENTIAL/PLATELET - Abnormal; Notable for the following components:   RDW 15.9 (*)    All other components within normal limits  URINALYSIS, ROUTINE W REFLEX MICROSCOPIC - Abnormal; Notable for the following components:   Leukocytes,Ua SMALL (*)    All other components within normal limits  URINALYSIS, MICROSCOPIC (REFLEX) - Abnormal; Notable for the following components:   Bacteria, UA RARE (*)    All other  components within normal limits  URINE CULTURE  BASIC METABOLIC PANEL  ABO/RH    EKG None  Radiology US OB LESS THAN 14 WEEKS WITH OB TRANSVAGINAL  Result Date: 02/06/2021 CLINICAL DATA:  Vaginal bleeding. Estimated gestational age of [redacted] weeks, 5 days by LMP. EXAM: OBSTETRIC <14 WK [place] AND TRANSVAGINAL OB US TECHNIQUE: Both transabdominal and transvaginal ultrasound examinations were performed for complete evaluation of the gestation as well as the maternal uterus, adnexal regions, and pelvic cul-de-sac. Transvaginal technique was performed to assess early pregnancy. COMPARISON:  Pelvic ultrasound dated October 06, 2018. FINDINGS: Intrauterine gestational sac: None. Maternal uterus/adnexae: Fluid within the endometrial canal. Left ovarian corpus luteum. Small amount of  free fluid in the pelvis. IMPRESSION: 1. No IUP is visualized. By definition, in the setting of a positive pregnancy test, this reflects a pregnancy of unknown location. Differential considerations include early normal IUP, abnormal IUP/missed abortion, or nonvisualized ectopic pregnancy. Serial beta HCG is suggested. Consider repeat pelvic ultrasound in 14 days. Electronically Signed   By: Titus Dubin M.D.   On: 02/06/2021 17:50    Procedures Procedures    Medications Ordered in ED Medications - No data to display  ED Course/ Medical Decision Making/ A&P Clinical Course as of 02/07/21 0116  Thu Feb 06, 2021  1830 Spoke to Dr. Kennon Rounds with OB/GYN advised to have patient follow-up at [facility] on Saturday for repeat quantitative hCG testing. [PB]    Clinical Course User Index [PB] Loni Beckwith, PA-C                           Medical Decision Making  Alert 31 year old female no acute stress, nontoxic-appearing.  Presents emergency department patient complains of vaginal bleeding and abdominal cramping in pregnancy.  LMP 12/14/2020.  Patient had positive urine pregnancy test 2 days prior.  G6 P4-0-1-4.  Patient reports  that all previous pregnancies were continuous vaginal delivery.  Endorses hypertension during pregnancy.  Denies any other complications during pregnancy.  Patient hemodynamically stable on arrival.  No active vaginal bleeding.  I ordered lab work and independently reviewed it.  Significant findings include:  -Quantitative hCG 504  -ABO Rh: A+  -No signs of anemia  -Urinalysis shows bacteria rare, WBC 0-5, nitrite negative, leukocyte small, protein negative  I ordered ultrasound imaging to assess for possible ectopic pregnancy.  Ultrasound imaging shows no intrauterine pregnancy visualized.  Small amount of free fluid in the pelvis.  Patient was offered pain medication for pain management however declines at this time.  Patient states that pain is manageable.  On physical exam cervical os is noted to be open.  No bleeding noted from cervical os.  No bleeding or products of conception found within vaginal vault.  I obtain consultation from on-call OB/GYN provider.  Spoke with Dr. Kennon Rounds who advised patient will need to follow-up with [facility]U in 48 hours for repeat hCG testing.  Due to vaginal bleeding in pregnancy less than 20 weeks and open cervical os concern for possible spontaneous abortion.  Patient advised of possible spontaneous abortion.  Patient informed of follow-up with [facility].  Importance of immediate follow-up stressed to the patient.  Discussed results, findings, treatment and follow up. Patient advised of return precautions. Patient verbalized understanding and agreed with plan.         Final Clinical Impression(s) / ED Diagnoses Final diagnoses:  Vaginal bleeding in pregnancy    Rx / DC Orders ED Discharge Orders     None         Dyann Ruddle 02/07/21 0123    Gareth Morgan, MD 02/07/21 1130

## 2021-02-06 NOTE — Discharge Instructions (Signed)
[facility] to be evaluated for your abdominal cramping and vaginal bleeding in the setting of pregnancy.  The ultrasound obtained did not show any signs of an intrauterine pregnancy.  There is concern for a possible miscarriage.  You will need to follow-up with the returning assessment unit at [facility] or with your OB/GYN provider on Saturday for repeat hCG testing.  It is very important that the testing occur on this day.  Get help right away if: You have severe cramps in your back or abdomen. You pass large clots or a large amount of tissue from your vagina. Your bleeding increases. You feel light-headed or weak, or you faint. You are leaking fluid or have a gush of fluid from your vagina.

## 2021-02-06 NOTE — Progress Notes (Signed)
° °  OB/GYN Telephone Consult  02/06/2021   Julia Roth is a 31 y.o. [phone] who is currently pregnant presenting to [place].   I was called for a consult regarding the care of this patient by the PA caring for the patient.   The provider had the following clinical question: Needs f/u HCG  The provider presented the following relevant clinical information: + UPT, bleeding, nothing in the uterus, HCG 504, A pos  I performed a chart review on the patient and reviewed available documentation.  BP 117/79 (BP Location: Right Arm)    Pulse (!) 59    Temp 98.5 F (36.9 C) (Oral)    Resp 16    Ht 5\' 4"  (1.626 m)    Wt 70.3 kg    LMP 12/13/2020    SpO2 100%    BMI 26.61 kg/m   Exam- performed by consulting provider   Recommendations:  -f/u HCG in [facility] at [facility] on 1/14 -ectopic precautions -appointment made for AVS  Thank you for this consult and if additional recommendations are needed please call [phone] for the OB/GYN attending on service at [facility].   I spent approximately 5 minutes directly consulting with the provider and verbally discussing this case. Additionally 10 minutes minutes was spent performing chart review and documentation.   Donnamae Jude, MD

## 2021-02-06 NOTE — ED Triage Notes (Addendum)
Pt c/o vaginal bleeding yesterday-2 pad count-none today-c/o abd cramps-pt states she is ~[redacted]weeks pregnant by LMP-G5 P4-NAD-to triage in w/c-stood for weight w/o difficulty

## 2021-02-07 LAB — URINE CULTURE

## 2021-02-08 ENCOUNTER — Inpatient Hospital Stay (HOSPITAL_COMMUNITY)
Admission: AD | Admit: 2021-02-08 | Discharge: 2021-02-08 | Disposition: A | Discharge status: Home / Self Care | Payer: Medicaid Other | Attending: Obstetrics and Gynecology | Admitting: Obstetrics and Gynecology

## 2021-02-08 ENCOUNTER — Telehealth: Payer: Self-pay | Admitting: Obstetrics and Gynecology

## 2021-02-08 ENCOUNTER — Other Ambulatory Visit (HOSPITAL_COMMUNITY): Admit: 2021-02-08 | Payer: Medicaid Other

## 2021-02-08 ENCOUNTER — Other Ambulatory Visit: Payer: Self-pay

## 2021-02-08 ENCOUNTER — Encounter (HOSPITAL_COMMUNITY): Payer: Self-pay | Admitting: Obstetrics and Gynecology

## 2021-02-08 DIAGNOSIS — O3680X0 Pregnancy with inconclusive fetal viability, not applicable or unspecified: Secondary | ICD-10-CM | POA: Diagnosis not present

## 2021-02-08 DIAGNOSIS — O200 Threatened abortion: Secondary | ICD-10-CM | POA: Diagnosis not present

## 2021-02-08 DIAGNOSIS — Z3A01 Less than 8 weeks gestation of pregnancy: Secondary | ICD-10-CM | POA: Diagnosis not present

## 2021-02-08 LAB — HCG, QUANTITATIVE, PREGNANCY: hCG, Beta Chain, Quant, S: 1569 m[IU]/mL — ABNORMAL HIGH (ref <5)

## 2021-02-08 NOTE — [facility] Provider Note (Addendum)
History   Chief Complaint:  Follow-up   Julia Roth is  32 y.o. [id number] Patient's last menstrual period was 12/13/2020.Marland Kitchen Patient is here for follow up of quantitative HCG and ongoing surveillance of pregnancy status. She is [redacted]w[redacted]d weeks gestation  by LMP (12/13/2020.    Since her last visit, the patient is without new complaint. The patient reports bleeding as  none now.  She denies any pain.  General ROS:  negative  Her previous Quantitative HCG values are: 504  Physical Exam   Blood pressure 129/75, pulse 96, temperature 98.5 F (36.9 C), temperature source Oral, resp. rate 16, height 5\' 4"  (1.626 m), weight 71.7 kg, last menstrual period 12/13/2020, SpO2 99 %, unknown if currently breastfeeding.  Focused Gynecological Exam: not indicated  Labs: Results for orders placed or performed during the hospital encounter of 02/08/21 (from the past 24 hour(s))  hCG, quantitative, pregnancy   Collection Time: 02/08/21 10:50 AM  Result Value Ref Range   hCG, Beta Chain, Quant, S 1,569 (H) <5 mIU/mL    Ultrasound Studies:   US OB LESS THAN 14 WEEKS WITH OB TRANSVAGINAL  Result Date: 02/06/2021 CLINICAL DATA:  Vaginal bleeding. Estimated gestational age of [redacted] weeks, 5 days by LMP. EXAM: OBSTETRIC <14 WK [place] AND TRANSVAGINAL OB US TECHNIQUE: Both transabdominal and transvaginal ultrasound examinations were performed for complete evaluation of the gestation as well as the maternal uterus, adnexal regions, and pelvic cul-de-sac. Transvaginal technique was performed to assess early pregnancy. COMPARISON:  Pelvic ultrasound dated October 06, 2018. FINDINGS: Intrauterine gestational sac: None. Maternal uterus/adnexae: Fluid within the endometrial canal. Left ovarian corpus luteum. Small amount of free fluid in the pelvis. IMPRESSION: 1. No IUP is visualized. By definition, in the setting of a positive pregnancy test, this reflects a pregnancy of unknown location. Differential considerations  include early normal IUP, abnormal IUP/missed abortion, or nonvisualized ectopic pregnancy. Serial beta HCG is suggested. Consider repeat pelvic ultrasound in 14 days. Electronically Signed   By: Titus Dubin M.D.   On: 02/06/2021 17:50    Assessment:   1. Pregnancy of unknown anatomic location   2. [redacted] weeks gestation of pregnancy   3. Threatened miscarriage in early pregnancy       Plan: -Discharge home in stable condition -Threatened miscarriage precautions discussed -Patient advised to follow-up with her OB/GYN through [facility] -Patient opts to leave and requests that results be called on her cell phone -Patient may return to [facility]U as needed or if her condition were to change or worsen - See telephone encounter for patient notifcation  Laury Deep, CNM 02/08/2021, 10:54 AM

## 2021-02-08 NOTE — Discharge Instructions (Signed)
Return to MAU: If you have heavier bleeding that soaks through more that 2 pads per hour for an hour or more If you bleed so much that you feel like you might pass out or you do pass out If you have significant abdominal pain that is not improved with Tylenol 1000 mg every 8 hours as needed for pain If you develop a fever > 100.5  

## 2021-02-08 NOTE — Telephone Encounter (Signed)
TC to patient. Patient identification verified by DOB and last 4 digits of SSN. Patient notified of HCG level of 1569. Advised this is a normal rise (actually more than expected) in HCG. Scheduled to follow-up with OB through [facility] on 02/20/2021. Recommend repeat US in 2 weeks. Patient expresses relief from the news she has just been given. Patient verbalized an understanding of the plan of care and agrees.   Laury Deep, CNM

## 2021-02-08 NOTE — MAU Note (Addendum)
Seen at [facility] on 1/12, told to come here for repeat blood work.  Bleeding stopped on the 12th and no longer having pain.  Was told on the 12th that her cervix was starting to open.  Reports hx of PTL with 2nd pregnancy.

## 2022-02-04 ENCOUNTER — Emergency Department (HOSPITAL_BASED_OUTPATIENT_CLINIC_OR_DEPARTMENT_OTHER): Payer: Medicaid Other

## 2022-02-04 ENCOUNTER — Other Ambulatory Visit: Payer: Self-pay

## 2022-02-04 ENCOUNTER — Encounter (HOSPITAL_BASED_OUTPATIENT_CLINIC_OR_DEPARTMENT_OTHER): Payer: Self-pay | Admitting: Urology

## 2022-02-04 ENCOUNTER — Emergency Department (HOSPITAL_BASED_OUTPATIENT_CLINIC_OR_DEPARTMENT_OTHER)
Admission: EM | Admit: 2022-02-04 | Discharge: 2022-02-04 | Disposition: A | Discharge status: Home / Self Care | Payer: Medicaid Other | Attending: Emergency Medicine | Admitting: Emergency Medicine

## 2022-02-04 DIAGNOSIS — Z1152 Encounter for screening for COVID-19: Secondary | ICD-10-CM | POA: Diagnosis not present

## 2022-02-04 DIAGNOSIS — J069 Acute upper respiratory infection, unspecified: Secondary | ICD-10-CM

## 2022-02-04 DIAGNOSIS — R059 Cough, unspecified: Secondary | ICD-10-CM | POA: Diagnosis present

## 2022-02-04 LAB — URINALYSIS, ROUTINE W REFLEX MICROSCOPIC
Bilirubin Urine: NEGATIVE
Glucose, UA: NEGATIVE mg/dL
Ketones, ur: NEGATIVE mg/dL
Leukocytes,Ua: NEGATIVE
Nitrite: NEGATIVE
Protein, ur: 100 mg/dL — AB
Specific Gravity, Urine: 1.025 (ref 1.005–1.030)
pH: 7 (ref 5.0–8.0)

## 2022-02-04 LAB — COMPREHENSIVE METABOLIC PANEL
ALT: 15 U/L (ref 0–44)
AST: 17 U/L (ref 15–41)
Albumin: 4.1 g/dL (ref 3.5–5.0)
Alkaline Phosphatase: 56 U/L (ref 38–126)
Anion gap: 7 (ref 5–15)
BUN: 11 mg/dL (ref 6–20)
CO2: 23 mmol/L (ref 22–32)
Calcium: 8.6 mg/dL — ABNORMAL LOW (ref 8.9–10.3)
Chloride: 108 mmol/L (ref 98–111)
Creatinine, Ser: 0.81 mg/dL (ref 0.44–1.00)
GFR, Estimated: >60 mL/min (ref >60)
Glucose, Bld: 86 mg/dL (ref 70–99)
Potassium: 3.9 mmol/L (ref 3.5–5.1)
Sodium: 138 mmol/L (ref 135–145)
Total Bilirubin: 0.4 mg/dL (ref 0.3–1.2)
Total Protein: 6.8 g/dL (ref 6.5–8.1)

## 2022-02-04 LAB — CBC WITH DIFFERENTIAL/PLATELET
Abs Immature Granulocytes: 0.01 10*3/uL (ref 0.00–0.07)
Basophils Absolute: 0.1 10*3/uL (ref 0.0–0.1)
Basophils Relative: 1 %
Eosinophils Absolute: 0.2 10*3/uL (ref 0.0–0.5)
Eosinophils Relative: 4 %
HCT: 41.8 % (ref 36.0–46.0)
Hemoglobin: 13.6 g/dL (ref 12.0–15.0)
Immature Granulocytes: 0 %
Lymphocytes Relative: 12 %
Lymphs Abs: 0.7 10*3/uL (ref 0.7–4.0)
MCH: 30.3 pg (ref 26.0–34.0)
MCHC: 32.5 g/dL (ref 30.0–36.0)
MCV: 93.1 fL (ref 80.0–100.0)
Monocytes Absolute: 1.1 10*3/uL — ABNORMAL HIGH (ref 0.1–1.0)
Monocytes Relative: 20 %
Neutro Abs: 3.4 10*3/uL (ref 1.7–7.7)
Neutrophils Relative %: 63 %
Platelets: 181 10*3/uL (ref 150–400)
RBC: 4.49 MIL/uL (ref 3.87–5.11)
RDW: 15.7 % — ABNORMAL HIGH (ref 11.5–15.5)
WBC: 5.4 10*3/uL (ref 4.0–10.5)
nRBC: 0 % (ref 0.0–0.2)

## 2022-02-04 LAB — URINALYSIS, MICROSCOPIC (REFLEX)
RBC / HPF: >50 RBC/hpf (ref 0–5)
Squamous Epithelial / HPF: NONE SEEN /HPF (ref 0–5)

## 2022-02-04 LAB — RESP PANEL BY RT-PCR (RSV, FLU A&B, COVID)  RVPGX2
Influenza A by PCR: NEGATIVE
Influenza B by PCR: NEGATIVE
Resp Syncytial Virus by PCR: NEGATIVE
SARS Coronavirus 2 by RT PCR: NEGATIVE

## 2022-02-04 LAB — PREGNANCY, URINE: Preg Test, Ur: NEGATIVE

## 2022-02-04 LAB — LIPASE, BLOOD: Lipase: 30 U/L (ref 11–51)

## 2022-02-04 NOTE — ED Provider Notes (Signed)
[facility] DEPART[facility] Provider Note   CSN: [id number] Arrival date & time: 02/04/22  1237     History  No chief complaint on file.   Julia Roth is a 32 y.o. female.  HPI 32 year old female presenting with several days of nasal congestion, cough, nausea, vomiting, body aches and chills.  Reports that her mother was diagnosed with pneumonia.  She is concerned about infecting her 46-monthold daughter.  She endorses nausea and vomiting but no significant abdominal pain.  She does note that she is on her period and is having some mild abdominal cramping which is not abnormal for her.  Also endorses some diarrhea, nonbloody over the last several days.  She has some chest pain with coughing and at times with a deep breath.    Home Medications Prior to Admission medications   Medication Sig Start Date End Date Taking? Authorizing Provider  Prenatal Vit-Fe Fumarate-FA (PRENATAL MULTIVITAMIN) TABS tablet Take 1 tablet by mouth daily at 12 noon.     [provider]      Allergies    Mushroom extract complex    Review of Systems   Review of Systems Ten systems reviewed and are negative for acute change, except as noted in the HPI.   Physical Exam Updated Vital Signs BP (!) 143/106 (BP Location: Right Arm)   Pulse (!) 56   Temp 98.2 F (36.8 C) (Oral)   Resp 20   Ht '5\' 4"'$  (1.626 m)   Wt 88.5 kg   SpO2 99%   Breastfeeding No   BMI 33.47 kg/m  Physical Exam Vitals and nursing note reviewed.  Constitutional:      General: She is not in acute distress.    Appearance: She is well-developed.  HENT:     Head: Normocephalic and atraumatic.  Eyes:     Conjunctiva/sclera: Conjunctivae normal.  Cardiovascular:     Rate and Rhythm: Normal rate and regular rhythm.     Heart sounds: No murmur heard. Pulmonary:     Effort: Pulmonary effort is normal. No respiratory distress.     Breath sounds: Normal breath sounds.  Abdominal:     Palpations:  Abdomen is soft.     Tenderness: There is no abdominal tenderness.  Musculoskeletal:        General: No swelling.     Cervical back: Neck supple.  Skin:    General: Skin is warm and dry.     Capillary Refill: Capillary refill takes less than 2 seconds.  Neurological:     General: No focal deficit present.     Mental Status: She is alert and oriented to person, place, and time.  Psychiatric:        Mood and Affect: Mood normal.     ED Results / Procedures / Treatments   Labs (all labs ordered are listed, but only abnormal results are displayed) Labs Reviewed  COMPREHENSIVE METABOLIC PANEL - Abnormal; Notable for the following components:      Result Value   Calcium 8.6 (*)    All other components within normal limits  CBC WITH DIFFERENTIAL/PLATELET - Abnormal; Notable for the following components:   RDW 15.7 (*)    Monocytes Absolute 1.1 (*)    All other components within normal limits  URINALYSIS, ROUTINE W REFLEX MICROSCOPIC - Abnormal; Notable for the following components:   Color, Urine RED (*)    APPearance CLOUDY (*)    Hgb urine dipstick LARGE (*)    Protein, ur 100 (*)  All other components within normal limits  URINALYSIS, MICROSCOPIC (REFLEX) - Abnormal; Notable for the following components:   Bacteria, UA RARE (*)    All other components within normal limits  RESP PANEL BY RT-PCR (RSV, FLU A&B, COVID)  RVPGX2  LIPASE, BLOOD  PREGNANCY, URINE    EKG None  Radiology DG Chest Portable 1 View  Result Date: 02/04/2022 CLINICAL DATA:  Cough. EXAM: PORTABLE CHEST 1 VIEW COMPARISON:  Radiographs 10/01/2020 and 10/06/2018. FINDINGS: 1655 hours. The heart size and mediastinal contours are stable. The lungs are clear. There is no pleural effusion or pneumothorax. No acute osseous findings are identified. There is a mild thoracolumbar scoliosis which appears unchanged. IMPRESSION: No evidence of active cardiopulmonary process. Mild thoracolumbar scoliosis.  Electronically Signed   By: Richardean Sale M.D.   On: 02/04/2022 17:18    Procedures Procedures    Medications Ordered in ED Medications - No data to display  ED Course/ Medical Decision Making/ A&P                           Medical Decision Making Amount and/or Complexity of Data Reviewed Labs: ordered. Radiology: ordered.  32 year old female presenting with URI type symptoms.  She is overall well-appearing on arrival.  She is slightly hypertensive but vitals are otherwise normal.  She has a soft and nontender abdomen on exam.  Her lung sounds are clear.  Lab work ordered, reviewed, CBC unremarkable, CMP without any significant abnormalities noted.  Lipase is normal.  Her COVID flu and RSV are negative.  Her UA shows a large amount of hemoglobin, proteinuria but no evidence of UTI.  Pregnancy test is negative.  Overall, suspect a viral infection.  I have low suspicion for ACS, PE.  Abdomen is soft and nontender, low suspicion for acute abdomen.  EKG reviewed, nonischemic.  Encouraged over-the-counter medicines, supportive care.  We discussed return precautions.  She voiced understanding is agreeable.  Stable for discharge.  Final Clinical Impression(s) / ED Diagnoses Final diagnoses:  Viral URI    Rx / DC Orders ED Discharge Orders     None         Lyndel Safe 02/04/22 1757    Wyvonnia Dusky, MD 02/04/22 2044

## 2022-02-04 NOTE — Discharge Instructions (Addendum)
Your workup today was overall reassuring.  Please take over-the-counter cold and flu medicines, Tylenol, ibuprofen Flonase, Zyrtec.  Make sure to drink plenty of fluids.  Return to the ER for any new or worsening symptoms.

## 2022-02-04 NOTE — ED Triage Notes (Signed)
Pt states cough, body aches, chills that started over night  Pt mom sick

## 2022-02-04 NOTE — ED Provider Triage Note (Signed)
Emergency Medicine Provider Triage Evaluation Note  Julia Roth , a 32 y.o. female  was evaluated in triage.  Pt complains of nausea and vomiting. States that she has thrown up about 5 times. Describes throwing up mucus. Also describes diarrhea >5x day over the past two days. She also endorses having chest pain with deep breathing and laying down. She has had cough. Denies having fever at home.    Review of Systems  Positive: See above Negative:   Physical Exam  BP (!) 135/97 (BP Location: Right Arm)   Pulse 86   Temp 98.5 F (36.9 C) (Oral)   Resp 18   Ht '5\' 4"'$  (1.626 m)   Wt 88.5 kg   SpO2 100%   Breastfeeding No   BMI 33.47 kg/m  Gen:   Awake, no distress   Resp:  Normal effort  MSK:   Moves extremities without difficulty  Other:    Medical Decision Making  Medically screening exam initiated at 3:49 PM.  Appropriate orders placed.  Julia Roth was informed that the remainder of the evaluation will be completed by another provider, this initial triage assessment does not replace that evaluation, and the importance of remaining in the ED until their evaluation is complete.    Mickie Hillier, PA-C 02/04/22 [phone]

## 2023-04-17 IMAGING — DX DG CHEST 2V
2 series · 2 of 2 positions shown · non-contrast
Comparison: 10/06/2018

CLINICAL DATA: Chest pain

EXAM:
CHEST - 2 VIEW

[chest pa]
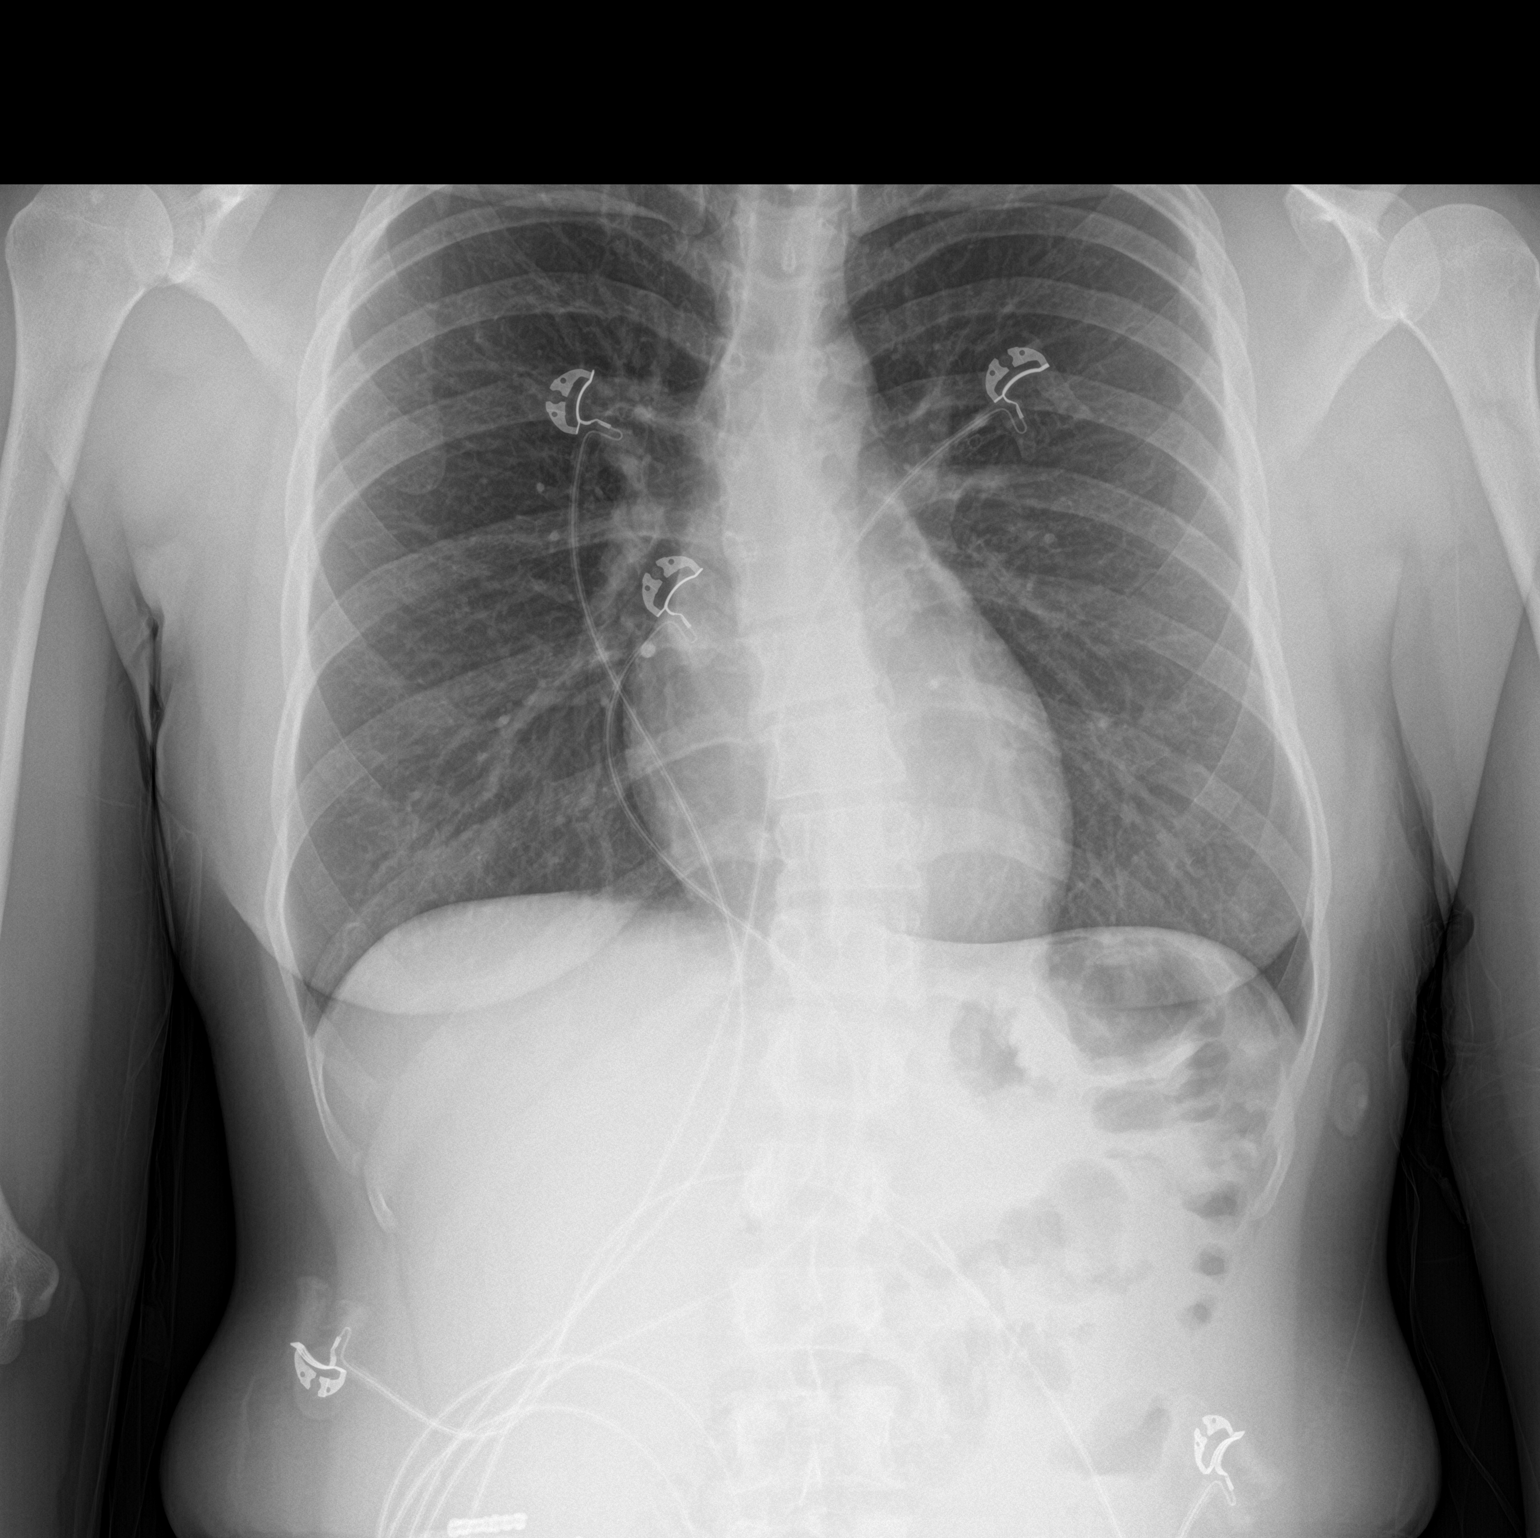

[chest lat]
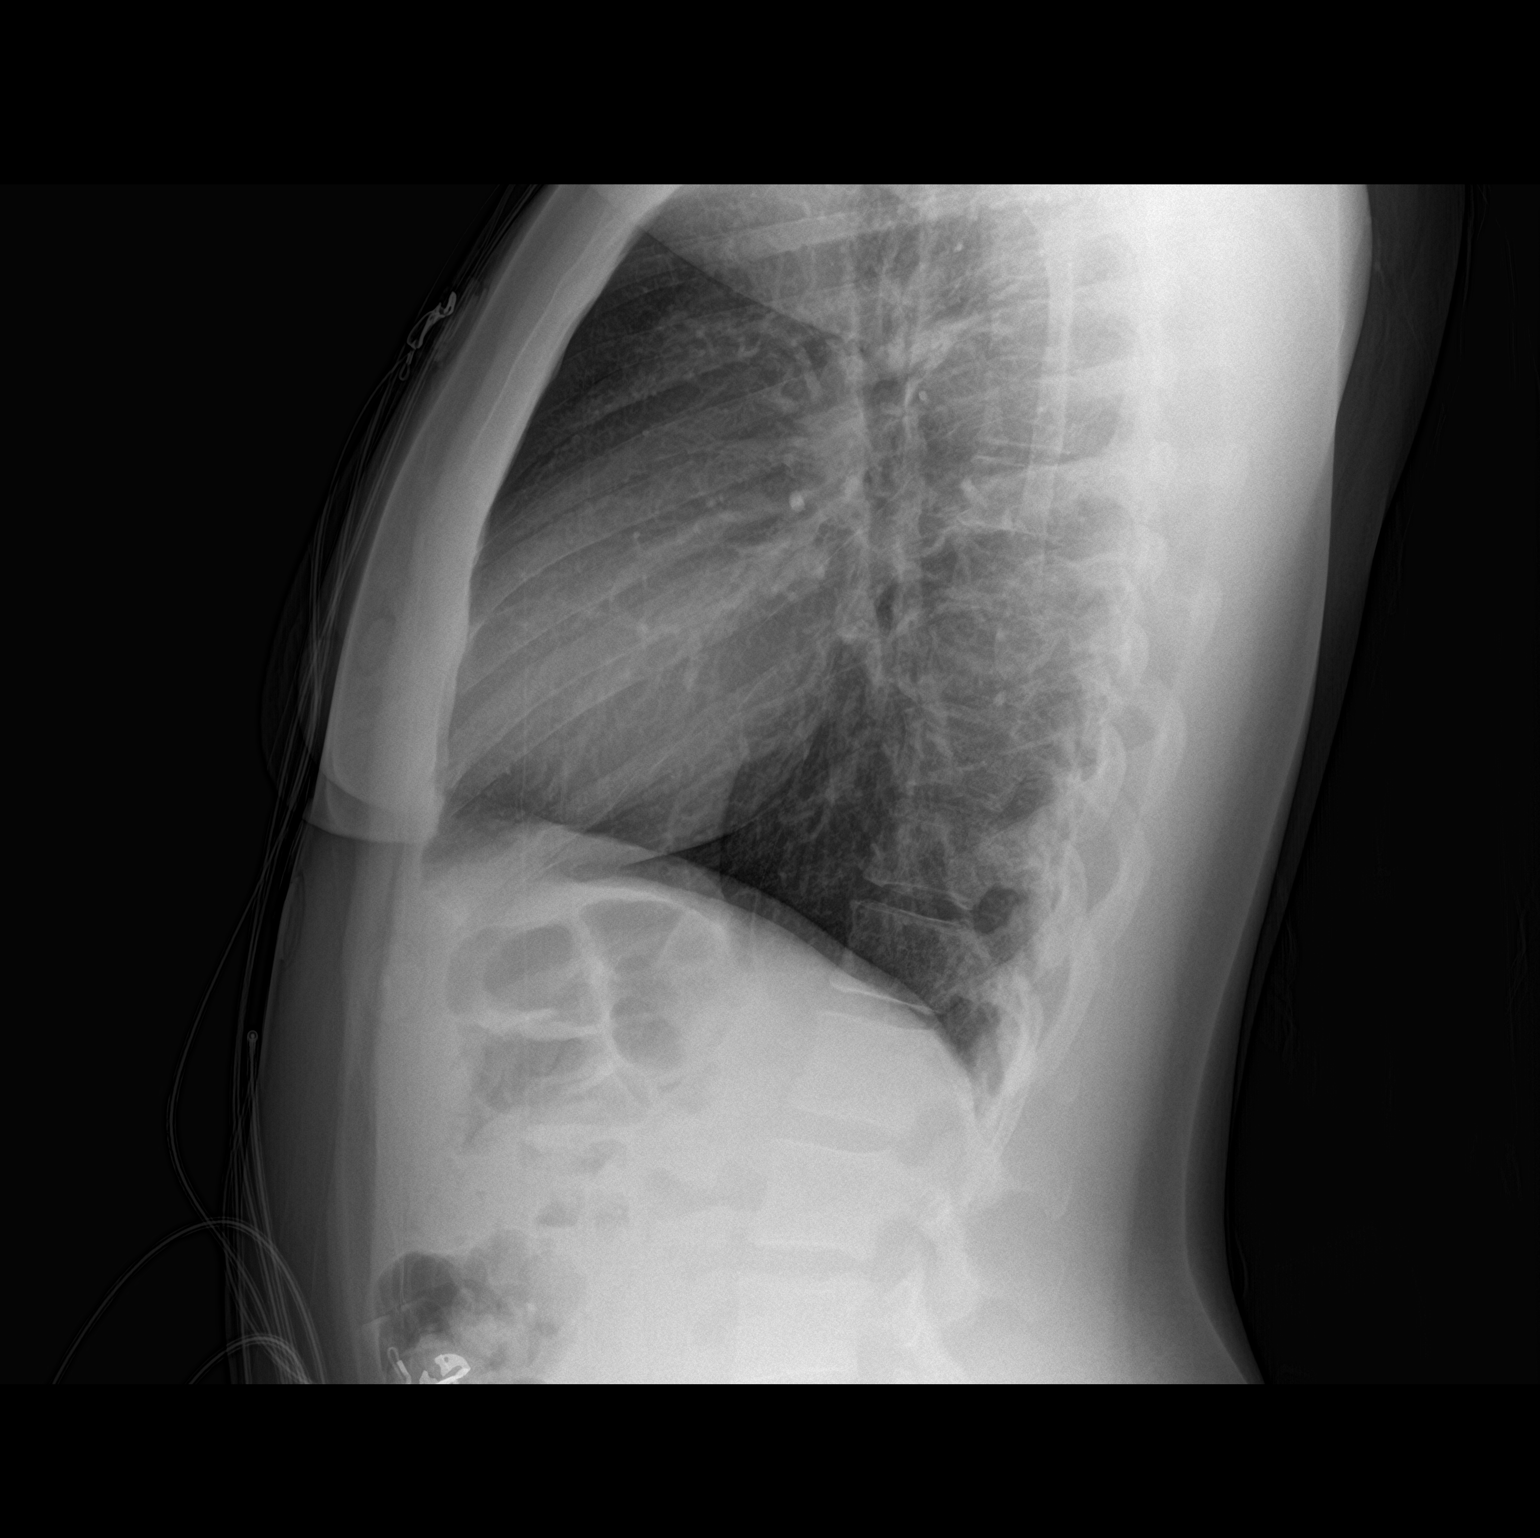

[2 of 2 positions shown; findings below may reference images not displayed]

FINDINGS: The heart size and mediastinal contours are within normal limits.
Both lungs are clear. The visualized skeletal structures are
unremarkable.
IMPRESSION: No active cardiopulmonary disease.

## 2023-08-23 IMAGING — US US OB < 14 WEEKS - US OB TV
1 series · 14 of 28 positions shown · non-contrast
Comparison: Pelvic ultrasound dated October 06, 2018.

CLINICAL DATA: Vaginal bleeding. Estimated gestational age of 7
weeks, 5 days by LMP.

EXAM:
OBSTETRIC <14 WK US AND TRANSVAGINAL OB US
TECHNIQUE: Both transabdominal and transvaginal ultrasound examinations were
performed for complete evaluation of the gestation as well as the
maternal uterus, adnexal regions, and pelvic cul-de-sac.
Transvaginal technique was performed to assess early pregnancy.

[Series 1: us ob < 14 weeks - us ob tv · 14 of 101 slices shown]
[im 4/101]
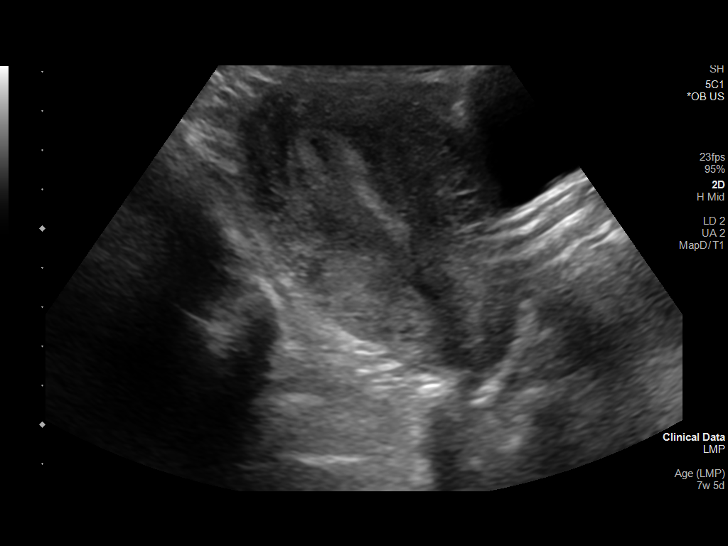
[im 12/101]
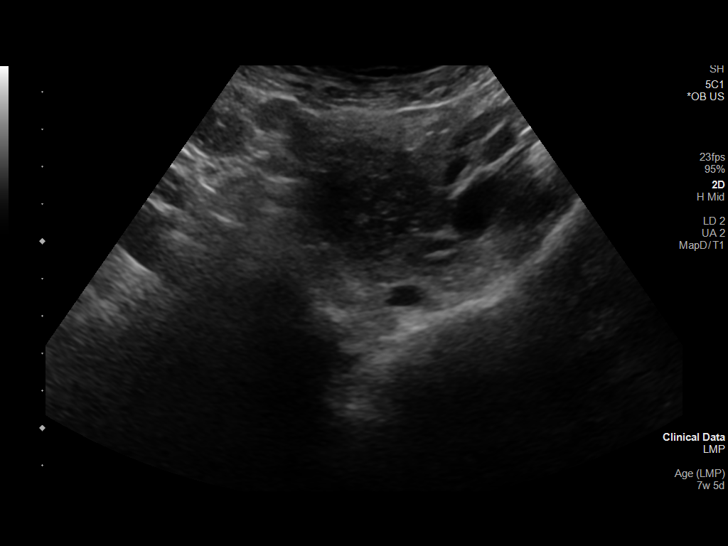
[im 19/101]
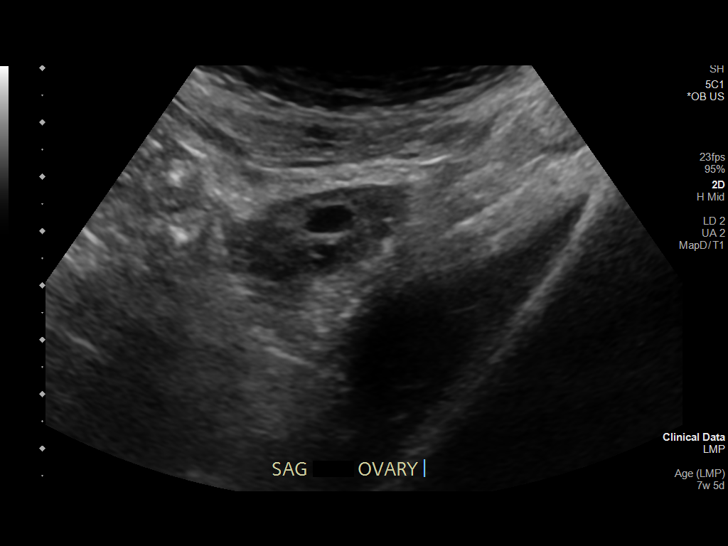
[im 26/101]
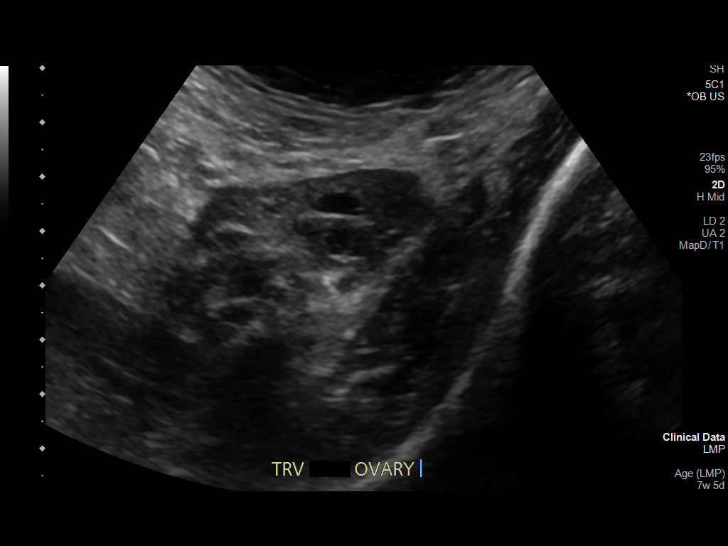
[im 34/101]
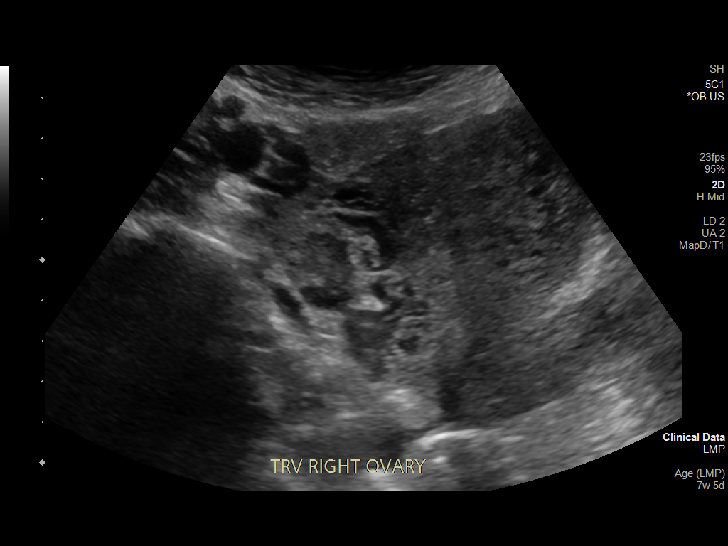
[im 41/101]
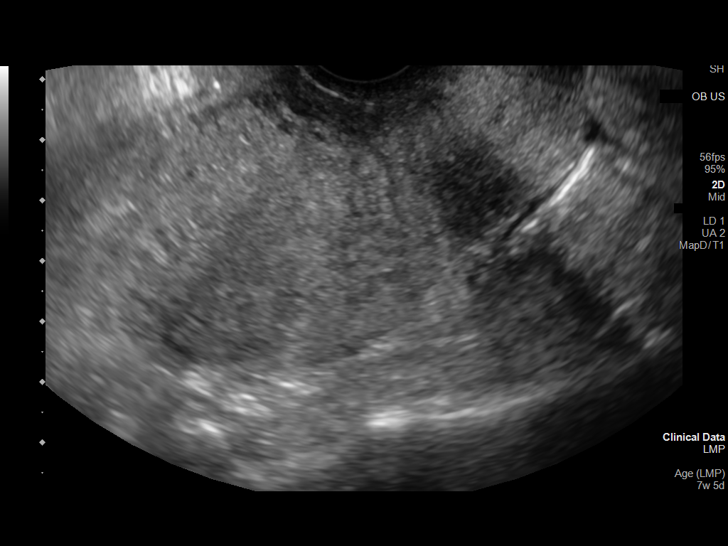
[im 49/101]
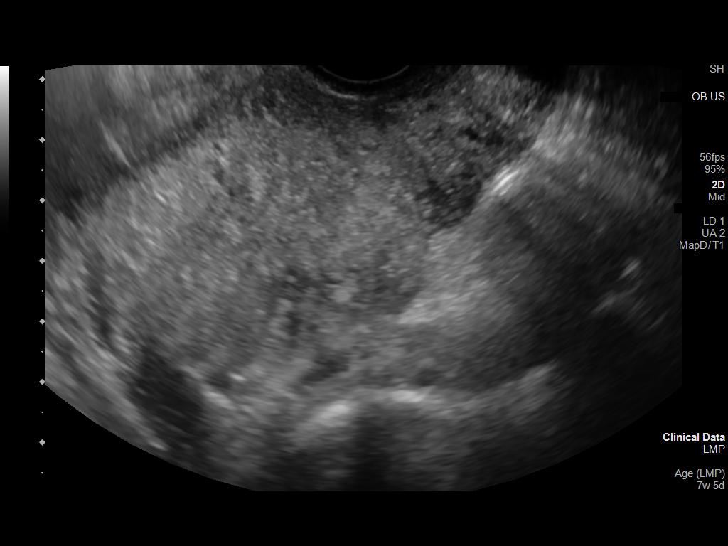
[im 56/101]
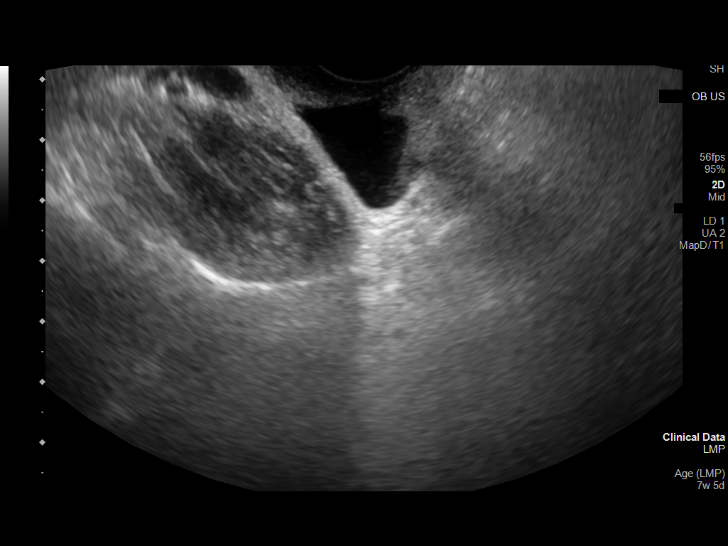
[im 63/101]
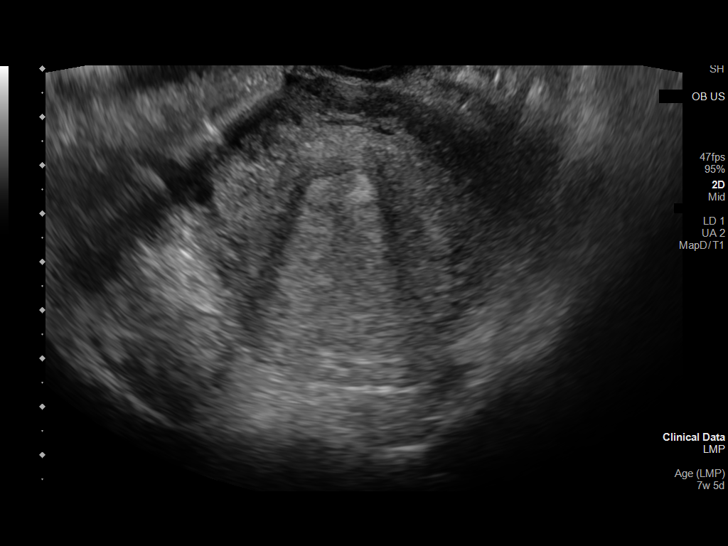
[im 71/101]
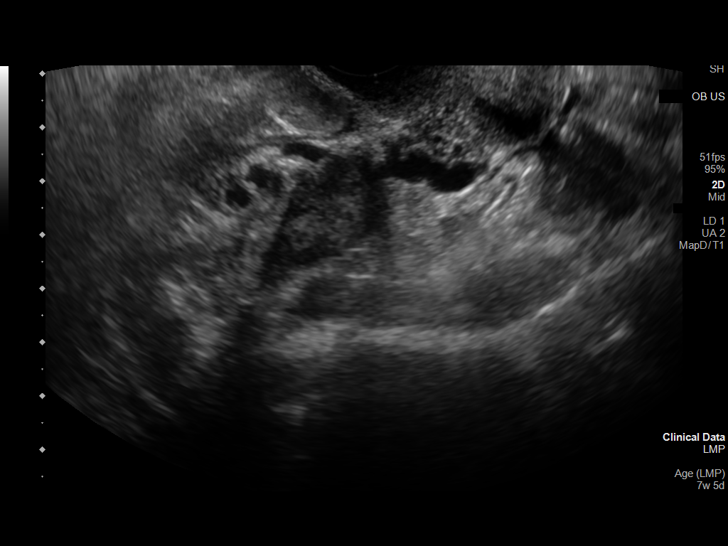
[im 78/101]
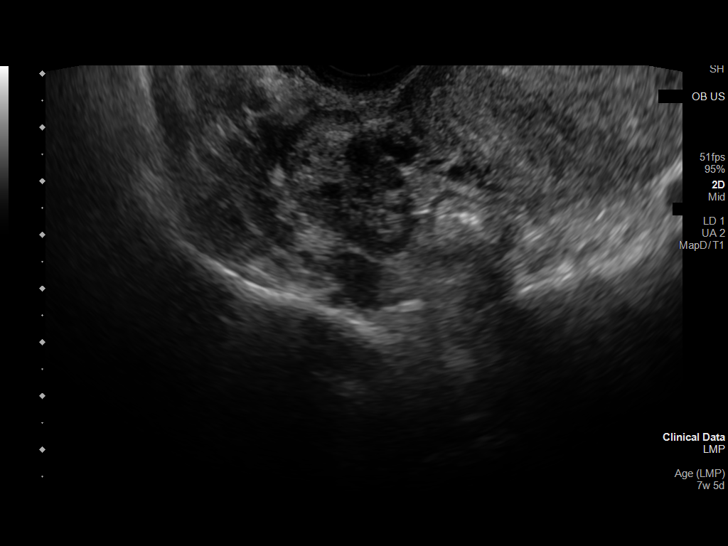
[im 86/101]
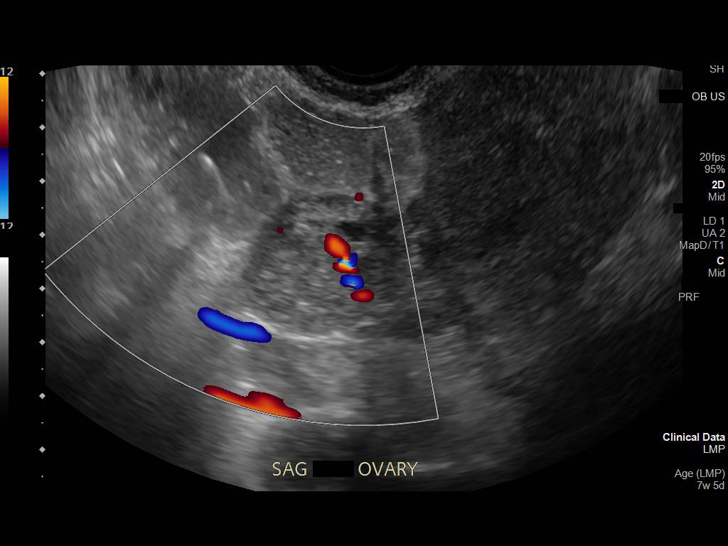
[im 93/101]
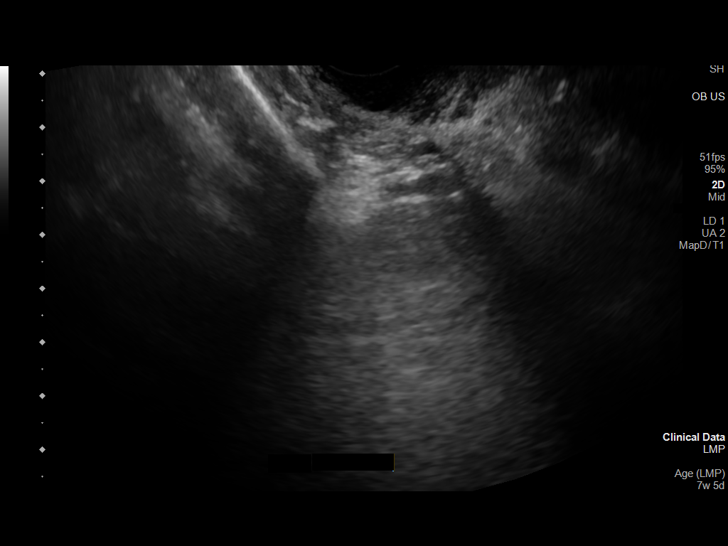
[im 101/101]
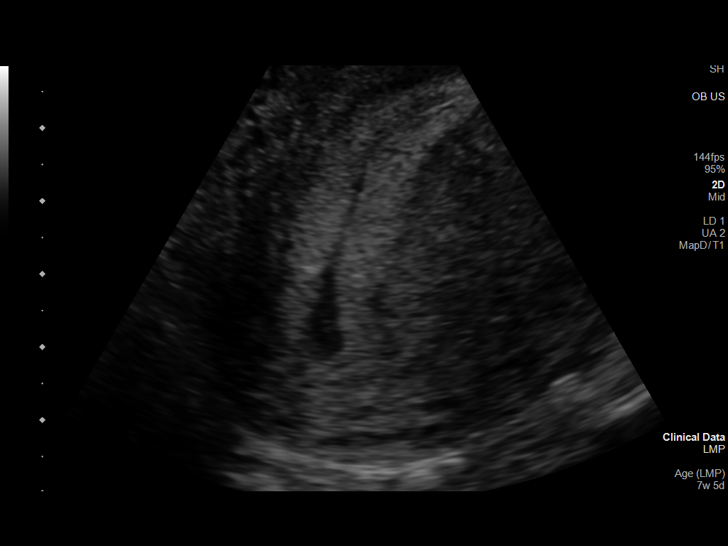

[14 of 28 positions shown; findings below may reference images not displayed]

FINDINGS: Intrauterine gestational sac: None.

Maternal uterus/adnexae: Fluid within the endometrial canal. Left
ovarian corpus luteum.

Small amount of free fluid in the pelvis.
IMPRESSION: 1. No IUP is visualized. By definition, in the setting of a positive
pregnancy test, this reflects a pregnancy of unknown location.
Differential considerations include early normal IUP, abnormal
IUP/missed abortion, or nonvisualized ectopic pregnancy. Serial beta
HCG is suggested. Consider repeat pelvic ultrasound in 14 days.
